# Patient Record
Sex: Female | Born: 1955 | Race: Black or African American | Hispanic: No | Smoking: Never smoker
Health system: Southern US, Community
[De-identification: ages and names within clinical notes are randomized; demographics above are authoritative.]

## PROBLEM LIST (undated history)

## (undated) DIAGNOSIS — M549 Dorsalgia, unspecified: Secondary | ICD-10-CM

## (undated) DIAGNOSIS — G4730 Sleep apnea, unspecified: Secondary | ICD-10-CM

## (undated) DIAGNOSIS — I10 Essential (primary) hypertension: Secondary | ICD-10-CM

## (undated) DIAGNOSIS — G4733 Obstructive sleep apnea (adult) (pediatric): Secondary | ICD-10-CM

## (undated) DIAGNOSIS — M2550 Pain in unspecified joint: Secondary | ICD-10-CM

## (undated) DIAGNOSIS — E7800 Pure hypercholesterolemia, unspecified: Secondary | ICD-10-CM

## (undated) DIAGNOSIS — E0590 Thyrotoxicosis, unspecified without thyrotoxic crisis or storm: Secondary | ICD-10-CM

## (undated) DIAGNOSIS — R609 Edema, unspecified: Secondary | ICD-10-CM

## (undated) DIAGNOSIS — R0602 Shortness of breath: Secondary | ICD-10-CM

## (undated) DIAGNOSIS — Z972 Presence of dental prosthetic device (complete) (partial): Secondary | ICD-10-CM

## (undated) DIAGNOSIS — I739 Peripheral vascular disease, unspecified: Secondary | ICD-10-CM

## (undated) DIAGNOSIS — Z9989 Dependence on other enabling machines and devices: Secondary | ICD-10-CM

## (undated) DIAGNOSIS — M1990 Unspecified osteoarthritis, unspecified site: Secondary | ICD-10-CM

## (undated) HISTORY — DX: Dorsalgia, unspecified: M54.9

## (undated) HISTORY — DX: Edema, unspecified: R60.9

## (undated) HISTORY — DX: Obstructive sleep apnea (adult) (pediatric): G47.33

## (undated) HISTORY — DX: Peripheral vascular disease, unspecified: I73.9

## (undated) HISTORY — DX: Pure hypercholesterolemia, unspecified: E78.00

## (undated) HISTORY — DX: Pain in unspecified joint: M25.50

## (undated) HISTORY — DX: Dependence on other enabling machines and devices: Z99.89

## (undated) HISTORY — PX: DILATION AND CURETTAGE OF UTERUS: SHX78

---

## 1983-01-23 HISTORY — PX: CHOLECYSTECTOMY: SHX55

## 1998-05-25 ENCOUNTER — Other Ambulatory Visit: Admission: RE | Admit: 1998-05-25 | Discharge: 1998-05-25 | Payer: Self-pay | Admitting: Family Medicine

## 1998-11-28 ENCOUNTER — Emergency Department (HOSPITAL_COMMUNITY): Admission: EM | Admit: 1998-11-28 | Discharge: 1998-11-28 | Payer: Self-pay | Admitting: *Deleted

## 2000-07-01 ENCOUNTER — Other Ambulatory Visit: Admission: RE | Admit: 2000-07-01 | Discharge: 2000-07-01 | Payer: Self-pay | Admitting: Family Medicine

## 2005-01-22 DIAGNOSIS — E0590 Thyrotoxicosis, unspecified without thyrotoxic crisis or storm: Secondary | ICD-10-CM

## 2005-01-22 HISTORY — DX: Thyrotoxicosis, unspecified without thyrotoxic crisis or storm: E05.90

## 2005-02-26 ENCOUNTER — Other Ambulatory Visit: Admission: RE | Admit: 2005-02-26 | Discharge: 2005-02-26 | Payer: Self-pay | Admitting: Family Medicine

## 2006-05-21 ENCOUNTER — Other Ambulatory Visit: Admission: RE | Admit: 2006-05-21 | Discharge: 2006-05-21 | Payer: Self-pay | Admitting: Obstetrics & Gynecology

## 2006-06-03 ENCOUNTER — Encounter (HOSPITAL_COMMUNITY): Admission: RE | Admit: 2006-06-03 | Discharge: 2006-06-04 | Payer: Self-pay | Admitting: Internal Medicine

## 2006-12-02 ENCOUNTER — Encounter: Admission: RE | Admit: 2006-12-02 | Discharge: 2006-12-02 | Payer: Self-pay | Admitting: Internal Medicine

## 2007-06-05 ENCOUNTER — Encounter: Admission: RE | Admit: 2007-06-05 | Discharge: 2007-06-05 | Payer: Self-pay | Admitting: Surgery

## 2008-01-22 ENCOUNTER — Other Ambulatory Visit: Admission: RE | Admit: 2008-01-22 | Discharge: 2008-01-22 | Payer: Self-pay | Admitting: Obstetrics & Gynecology

## 2009-08-22 ENCOUNTER — Ambulatory Visit: Payer: Self-pay | Admitting: Family Medicine

## 2009-08-22 ENCOUNTER — Encounter (INDEPENDENT_AMBULATORY_CARE_PROVIDER_SITE_OTHER): Payer: Self-pay | Admitting: Family Medicine

## 2009-08-22 LAB — CONVERTED CEMR LAB
ALT: 13 units/L (ref 0–35)
AST: 16 units/L (ref 0–37)
Albumin: 4.2 g/dL (ref 3.5–5.2)
Alkaline Phosphatase: 117 units/L (ref 39–117)
BUN: 10 mg/dL (ref 6–23)
Basophils Absolute: 0 10*3/uL (ref 0.0–0.1)
Basophils Relative: 1 % (ref 0–1)
CO2: 24 meq/L (ref 19–32)
Calcium: 8.7 mg/dL (ref 8.4–10.5)
Chloride: 106 meq/L (ref 96–112)
Cholesterol: 250 mg/dL — ABNORMAL HIGH (ref 0–200)
Creatinine, Ser: 0.77 mg/dL (ref 0.40–1.20)
Eosinophils Absolute: 0 10*3/uL (ref 0.0–0.7)
Eosinophils Relative: 1 % (ref 0–5)
Free T4: 1.11 ng/dL (ref 0.80–1.80)
Glucose, Bld: 94 mg/dL (ref 70–99)
HCT: 41.2 % (ref 36.0–46.0)
HDL: 78 mg/dL (ref >39)
Hemoglobin: 13.6 g/dL (ref 12.0–15.0)
LDL Cholesterol: 157 mg/dL — ABNORMAL HIGH (ref 0–99)
Lymphocytes Relative: 48 % — ABNORMAL HIGH (ref 12–46)
Lymphs Abs: 1.9 10*3/uL (ref 0.7–4.0)
MCHC: 33 g/dL (ref 30.0–36.0)
MCV: 82.6 fL (ref 78.0–100.0)
Monocytes Absolute: 0.3 10*3/uL (ref 0.1–1.0)
Monocytes Relative: 8 % (ref 3–12)
Neutro Abs: 1.7 10*3/uL (ref 1.7–7.7)
Neutrophils Relative %: 42 % — ABNORMAL LOW (ref 43–77)
Platelets: 293 10*3/uL (ref 150–400)
Potassium: 4 meq/L (ref 3.5–5.3)
RBC: 4.99 M/uL (ref 3.87–5.11)
RDW: 14.5 % (ref 11.5–15.5)
Sodium: 140 meq/L (ref 135–145)
T3, Free: 2.6 pg/mL (ref 2.3–4.2)
TSH: 0.065 microintl units/mL — ABNORMAL LOW (ref 0.350–4.500)
Total Bilirubin: 0.5 mg/dL (ref 0.3–1.2)
Total CHOL/HDL Ratio: 3.2
Total Protein: 7.5 g/dL (ref 6.0–8.3)
Triglycerides: 77 mg/dL (ref <150)
VLDL: 15 mg/dL (ref 0–40)
WBC: 4.1 10*3/uL (ref 4.0–10.5)

## 2009-08-30 ENCOUNTER — Ambulatory Visit (HOSPITAL_COMMUNITY): Admission: RE | Admit: 2009-08-30 | Discharge: 2009-08-30 | Payer: Self-pay | Admitting: Family Medicine

## 2009-09-23 ENCOUNTER — Ambulatory Visit: Payer: Self-pay | Admitting: Internal Medicine

## 2009-10-07 ENCOUNTER — Encounter (INDEPENDENT_AMBULATORY_CARE_PROVIDER_SITE_OTHER): Payer: Self-pay | Admitting: Family Medicine

## 2009-10-07 LAB — CONVERTED CEMR LAB
Basophils Absolute: 0 10*3/uL (ref 0.0–0.1)
Basophils Relative: 1 % (ref 0–1)
Eosinophils Absolute: 0 10*3/uL (ref 0.0–0.7)
Eosinophils Relative: 1 % (ref 0–5)
HCT: 45.9 % (ref 36.0–46.0)
Hemoglobin: 14.6 g/dL (ref 12.0–15.0)
Lymphocytes Relative: 43 % (ref 12–46)
Lymphs Abs: 2.1 10*3/uL (ref 0.7–4.0)
MCHC: 31.8 g/dL (ref 30.0–36.0)
MCV: 84.4 fL (ref 78.0–100.0)
Monocytes Absolute: 0.4 10*3/uL (ref 0.1–1.0)
Monocytes Relative: 8 % (ref 3–12)
Neutro Abs: 2.4 10*3/uL (ref 1.7–7.7)
Neutrophils Relative %: 48 % (ref 43–77)
Platelets: 309 10*3/uL (ref 150–400)
RBC: 5.44 M/uL — ABNORMAL HIGH (ref 3.87–5.11)
RDW: 15 % (ref 11.5–15.5)
TSH: 0.657 microintl units/mL (ref 0.350–4.500)
WBC: 5 10*3/uL (ref 4.0–10.5)

## 2010-01-22 DIAGNOSIS — N950 Postmenopausal bleeding: Secondary | ICD-10-CM | POA: Insufficient documentation

## 2010-04-03 ENCOUNTER — Encounter (HOSPITAL_COMMUNITY)
Admission: RE | Admit: 2010-04-03 | Discharge: 2010-04-03 | Disposition: A | Discharge status: Home / Self Care | Payer: BC Managed Care – PPO | Source: Ambulatory Visit | Attending: Obstetrics and Gynecology | Admitting: Obstetrics and Gynecology

## 2010-04-03 DIAGNOSIS — Z01810 Encounter for preprocedural cardiovascular examination: Secondary | ICD-10-CM | POA: Insufficient documentation

## 2010-04-03 DIAGNOSIS — Z01812 Encounter for preprocedural laboratory examination: Secondary | ICD-10-CM | POA: Insufficient documentation

## 2010-04-03 LAB — CBC
HCT: 40.1 % (ref 36.0–46.0)
Hemoglobin: 12.8 g/dL (ref 12.0–15.0)
MCH: 27.1 pg (ref 26.0–34.0)
MCHC: 31.9 g/dL (ref 30.0–36.0)
RBC: 4.72 MIL/uL (ref 3.87–5.11)

## 2010-04-03 LAB — BASIC METABOLIC PANEL
CO2: 26 mEq/L (ref 19–32)
Calcium: 9.2 mg/dL (ref 8.4–10.5)
Chloride: 105 mEq/L (ref 96–112)
Creatinine, Ser: 1.1 mg/dL (ref 0.4–1.2)
Glucose, Bld: 83 mg/dL (ref 70–99)
Sodium: 138 mEq/L (ref 135–145)

## 2010-04-14 ENCOUNTER — Other Ambulatory Visit: Payer: Self-pay | Admitting: Obstetrics and Gynecology

## 2010-04-14 ENCOUNTER — Ambulatory Visit (HOSPITAL_COMMUNITY)
Admission: RE | Admit: 2010-04-14 | Discharge: 2010-04-14 | Disposition: A | Discharge status: Home / Self Care | Payer: BC Managed Care – PPO | Source: Ambulatory Visit | Attending: Obstetrics and Gynecology | Admitting: Obstetrics and Gynecology

## 2010-04-14 DIAGNOSIS — N840 Polyp of corpus uteri: Secondary | ICD-10-CM | POA: Insufficient documentation

## 2010-04-14 DIAGNOSIS — N950 Postmenopausal bleeding: Secondary | ICD-10-CM | POA: Insufficient documentation

## 2010-04-20 NOTE — Op Note (Signed)
  Kaylee Carpenter, Kaylee Carpenter           ACCOUNT NO.:  [id number]  MEDICAL RECORD NO.:  [id number]           PATIENT TYPE:  O  LOCATION:  [facility]                          [facility]  PHYSICIAN:  Osborn Coho, M.D.   DATE OF BIRTH:  November 24, 1955  DATE OF PROCEDURE:  04/14/2010 DATE OF DISCHARGE:                              OPERATIVE REPORT   PREOPERATIVE DIAGNOSIS:  Postmenopausal bleeding.  POSTOPERATIVE DIAGNOSIS:  Postmenopausal bleeding.  PROCEDURES: 1. Hysteroscopy. 2. Dilatation and curettage.  ATTENDING SURGEON:  Osborn Coho, MD  ANESTHESIA:  General via LMA.  SPECIMENS TO PATHOLOGY:  Endometrial curettings.  FINDINGS:  Uterus sounded to approximately 9 cm.  FLUIDS:  1000 mL.  URINE OUTPUT:  Quantity sufficient via straight cath prior to procedure.  ESTIMATED BLOOD LOSS:  Minimal.  COMPLICATIONS:  None.  PROCEDURE IN DETAIL:  The patient was taken to the operating room after the risks, benefits, and alternatives discussed with the patient.  The patient verbalized understanding, consent signed and witnessed.  The patient was placed under general anesthesia and prepped and draped in normal sterile fashion in the dorsal lithotomy position.  A weighted speculum was placed in the patient's vagina and the anterior vaginal wall retracted and the anterior lip of the cervix grasped with a single- tooth tenaculum.  A paracervical block was administered using a total of 10 mL of 1% lidocaine.  The internal os was little stenotic and after dilation, the hysteroscope was able to be introduced and the uterus sounded to 9 cm.  Curettage was performed until a gritty texture was noted and endometrial curettings sent to pathology.  The hysteroscope was introduced once again and no obvious intracavitary lesions were noted.  All instruments were removed. There was good hemostasis at the tenaculum site.  Count was correct. The patient tolerated the procedure well and was awaiting  transfer to the recovery room in good condition.     Osborn Coho, M.D.     AR/MEDQ  D:  04/14/2010  T:  04/15/2010  Job:  [id number]  Electronically Signed by Osborn Coho M.D. on 04/20/2010 09:52:01 AM

## 2010-04-20 NOTE — H&P (Signed)
NAMERAMANI, RIVA NO.:  [id number]  MEDICAL RECORD NO.:  [id number]         PATIENT TYPE:  WAMB  LOCATION:                                [facility]  PHYSICIAN:  Osborn Coho, M.D.   DATE OF BIRTH:  04/22/1955  DATE OF ADMISSION:  04/14/2010 DATE OF DISCHARGE:                             HISTORY & PHYSICAL   HISTORY OF PRESENT ILLNESS:  Ms. Stanforth is a 55 year old divorced black female para 2-0-2-2 presenting for hysteroscopy D and C because of postmenopausal bleeding and endometrial masses.  During the patient's annual GYN exam in February 2012, she gave a history of having spotted vaginally on one occasion with wiping in the previous month.  She denies any cramping associated with this spotting, urinary tract symptoms, vaginitis symptoms, or dyspareunia.  The patient does admit, however, to occasional constipation.  A pelvic ultrasound showed uterus measuring 6.55 x 4.90 x 3.39 cm with 2 hyperechoic masses measuring 0.79 x 0.56 cm and 0.62 x 0.45 cm; both containing single blood flow with color flow Doppler, which are suggestive of polyps.  Additionally, the patient was noted to have a posterior fibroid measuring 1.70 x 1.40 x 1.64 cm. Given the patient's postmenopausal state and symptoms of vaginal bleeding with ultrasound findings, she has consented to proceed with hysteroscopy D and C for further evaluation and management.  PAST MEDICAL HISTORY/OB HISTORY:  Gravida 4, para 2-0-2-2.  The patient had one cesarean section and one spontaneous vaginal birth.  GYN HISTORY:  Menarche 55 years old.  She became menopausal at age 16. Denies any history of sexually transmitted diseases or abnormal Pap smears.  Her last Pap smear was in February 2012.  MEDICAL HISTORY:  Graves disease, anemia, and left ankle fracture.  SURGICAL HISTORY:  1961 tonsillectomy, 1990 cholecystectomy, 1993 left breast biopsy, which was benign.  Denies any problems with  anesthesia or history of blood transfusions.  FAMILY HISTORY:  Thyroid disease, colon cancer, hypertension.  HABITS:  She denies any alcohol, tobacco, or illicit drug use.  SOCIAL HISTORY:  The patient is divorced.  She works for [employer] as a Tourist information centre manager.  CURRENT MEDICATIONS:  Crestor 5 mg and a blood pressure medication, the patient does not recall the name of.  She just began this 3 weeks ago.  ALLERGIES:  She has no known drug allergies, but does admit to sensitivities to LATEX, which causes her skin to slough.  Denies any sensitivity to shellfish, peanuts, or soy.  REVIEW OF SYSTEMS:  The patient wears corrective lenses.  She has chronic back pain.  She does have some shortness of breath on exertion, but had a negative cardiac stress test 2 weeks ago.  Admits to frequent fatigue, occasional pedal edema, occasional headaches, occasional pain in her legs, but denies any chest pain, vision changes, nausea, vomiting, diarrhea, and except as is mentioned in history of present illness, the patient's review of systems is otherwise negative.  PHYSICAL EXAMINATION:  VITAL SIGNS:  Blood pressure is 130/80, pulse is 70, respirations 16, temperature 96.8 degrees Fahrenheit orally, weight 308 pounds, height 5 feet and 6 inches tall.  Body mass index  is 48. NECK:  Supple without masses.  There is no thyromegaly or cervical adenopathy. HEART:  Regular rate and rhythm. LUNGS:  Occasional rhonchi bilaterally that clear when the patient coughs. BACK:  No CVA tenderness. ABDOMEN:  No tenderness, masses, or organomegaly. EXTREMITIES:  No clubbing, cyanosis.  The patient does have bilateral brawny lower extremity edema. PELVIC:  EG, BUS is normal.  Vagina is normal.  Cervix is nontender without lesions.  Uterus appears normal size, shape, and consistency without tenderness.  Adnexa without tenderness or masses.  IMPRESSION: 1. Postmenopausal bleeding. 2. Endometrial  polyps.  DISPOSITION:  A discussion was held with the patient regarding the indications for her procedures along with their risks, which include but are not limited to reaction to anesthesia, damage to adjacent organs, infection, and bleeding.  The patient was given ACOG brochure on hysteroscopy.  She has consented to proceed with hysteroscopy D and C [facility] April 14, 2010, at 8:45 a.m.     [place] J. Lowell Guitar, P.A.-C   ______________________________ Osborn Coho, M.D.    EJP/MEDQ  D:  04/07/2010  T:  04/08/2010  Job:  [id number]  Electronically Signed by Raylene Everts. on 04/10/2010 10:23:42 PM Electronically Signed by Osborn Coho M.D. on 04/20/2010 09:51:59 AM

## 2010-05-30 ENCOUNTER — Other Ambulatory Visit: Payer: Self-pay | Admitting: Internal Medicine

## 2010-05-30 DIAGNOSIS — E042 Nontoxic multinodular goiter: Secondary | ICD-10-CM

## 2010-06-07 ENCOUNTER — Other Ambulatory Visit: Payer: Self-pay | Admitting: Interventional Radiology

## 2010-06-07 ENCOUNTER — Ambulatory Visit
Admission: RE | Admit: 2010-06-07 | Discharge: 2010-06-07 | Disposition: A | Discharge status: Home / Self Care | Payer: BC Managed Care – PPO | Source: Ambulatory Visit | Attending: Internal Medicine | Admitting: Internal Medicine

## 2010-06-07 ENCOUNTER — Other Ambulatory Visit (HOSPITAL_COMMUNITY)
Admission: RE | Admit: 2010-06-07 | Discharge: 2010-06-07 | Disposition: A | Discharge status: Home / Self Care | Payer: BC Managed Care – PPO | Source: Ambulatory Visit | Attending: Interventional Radiology | Admitting: Interventional Radiology

## 2010-06-07 ENCOUNTER — Ambulatory Visit: Admission: RE | Admit: 2010-06-07 | Payer: BC Managed Care – PPO | Source: Ambulatory Visit

## 2010-06-07 DIAGNOSIS — E042 Nontoxic multinodular goiter: Secondary | ICD-10-CM

## 2010-06-07 DIAGNOSIS — E049 Nontoxic goiter, unspecified: Secondary | ICD-10-CM | POA: Insufficient documentation

## 2012-01-07 ENCOUNTER — Encounter: Payer: Self-pay | Admitting: Obstetrics and Gynecology

## 2012-01-07 ENCOUNTER — Ambulatory Visit (INDEPENDENT_AMBULATORY_CARE_PROVIDER_SITE_OTHER): Payer: BC Managed Care – PPO | Admitting: Obstetrics and Gynecology

## 2012-01-07 VITALS — BP 98/60 | Ht 66.0 in | Wt 308.0 lb

## 2012-01-07 DIAGNOSIS — R638 Other symptoms and signs concerning food and fluid intake: Secondary | ICD-10-CM

## 2012-01-07 DIAGNOSIS — Z01419 Encounter for gynecological examination (general) (routine) without abnormal findings: Secondary | ICD-10-CM

## 2012-01-07 DIAGNOSIS — Z124 Encounter for screening for malignant neoplasm of cervix: Secondary | ICD-10-CM

## 2012-01-07 NOTE — Progress Notes (Signed)
Patient ID: Kaylee Carpenter, female   DOB: [birth year], 56 y.o.   MRN: [id number] Contraception PM Last pap 2012 Last Mammo 2 to 3 years ago Last Colonoscopy 1 yr ago Last Dexa Scan never Primary MD Allyne Gee, M.D. Abuse at Home None  No complaints  Filed Vitals:   01/07/12 1508  BP: 98/60   ROS: noncontributory  Physical Examination: General appearance - alert, well appearing, and in no distress Neck - supple, no significant adenopathy Chest - clear to auscultation, no wheezes, rales or rhonchi, symmetric air entry Heart - normal rate and regular rhythm Abdomen - soft, nontender, nondistended, no masses or organomegaly Breasts - breasts appear normal, no suspicious masses, no skin or nipple changes or axillary nodes Pelvic - normal external genitalia, vulva, vagina, cervix, uterus and adnexa, difficult secondary to habitus Back exam - no CVAT Extremities - no edema, redness or tenderness in the calves or thighs  A/P Pelvic u/s at [facility] to eval ovaries secondary to habitus Mammo at [facility] Pap today

## 2012-01-07 NOTE — Addendum Note (Signed)
Addended by: Marla Roe A on: 01/07/2012 04:01 PM   Modules accepted: Orders

## 2012-01-08 LAB — PAP IG W/ RFLX HPV ASCU

## 2012-02-11 ENCOUNTER — Other Ambulatory Visit: Payer: Self-pay | Admitting: Obstetrics and Gynecology

## 2012-02-11 DIAGNOSIS — R638 Other symptoms and signs concerning food and fluid intake: Secondary | ICD-10-CM

## 2012-02-12 ENCOUNTER — Encounter: Payer: BC Managed Care – PPO | Admitting: Obstetrics and Gynecology

## 2012-02-12 ENCOUNTER — Other Ambulatory Visit: Payer: BC Managed Care – PPO

## 2012-02-29 ENCOUNTER — Ambulatory Visit
Admission: RE | Admit: 2012-02-29 | Discharge: 2012-02-29 | Disposition: A | Discharge status: Home / Self Care | Payer: BC Managed Care – PPO | Source: Ambulatory Visit | Attending: Obstetrics and Gynecology | Admitting: Obstetrics and Gynecology

## 2012-02-29 DIAGNOSIS — Z01419 Encounter for gynecological examination (general) (routine) without abnormal findings: Secondary | ICD-10-CM

## 2012-03-04 ENCOUNTER — Encounter: Payer: BC Managed Care – PPO | Admitting: Obstetrics and Gynecology

## 2012-03-04 ENCOUNTER — Other Ambulatory Visit: Payer: BC Managed Care – PPO

## 2012-06-18 ENCOUNTER — Other Ambulatory Visit: Payer: Self-pay | Admitting: Obstetrics and Gynecology

## 2012-06-18 DIAGNOSIS — R102 Pelvic and perineal pain: Secondary | ICD-10-CM

## 2012-06-23 ENCOUNTER — Other Ambulatory Visit: Payer: BC Managed Care – PPO

## 2012-06-27 ENCOUNTER — Ambulatory Visit
Admission: RE | Admit: 2012-06-27 | Discharge: 2012-06-27 | Disposition: A | Discharge status: Home / Self Care | Payer: BC Managed Care – PPO | Source: Ambulatory Visit | Attending: Obstetrics and Gynecology | Admitting: Obstetrics and Gynecology

## 2012-06-27 DIAGNOSIS — R102 Pelvic and perineal pain: Secondary | ICD-10-CM

## 2012-06-27 MED ORDER — IOHEXOL 300 MG/ML  SOLN: 100.0000 mL | Freq: Once | INTRAMUSCULAR | Status: AC | PRN

## 2013-06-23 ENCOUNTER — Other Ambulatory Visit: Payer: Self-pay

## 2013-08-05 ENCOUNTER — Other Ambulatory Visit: Payer: Self-pay | Admitting: Obstetrics and Gynecology

## 2013-09-03 ENCOUNTER — Other Ambulatory Visit (HOSPITAL_COMMUNITY): Payer: Self-pay | Admitting: Obstetrics and Gynecology

## 2013-09-03 NOTE — H&P (Signed)
Kaylee Carpenter is a 58 y.o. female P: 2-0-2-2 presents for hysteroscopy, dilatation, curettage because of post-menopausal bleeding.  The patient has been menopausal since age 31 but in January 2015 she began random spotting that lasted until June 2015.  She denies any cramping associated with these episodes nor other menstrual-like  symptoms.  A pelvic ultrasound in June showed: uterus-4.46 x 4.63 x 3.69 cm that was retroflexed, endometrium-6.88 mm and uterine length from fundus to external os = 7 cm;  ovaries were not seen and the study was technically difficult due to patient's body habitus.  Endometrial biopsy done at that same time returned limited specimen but  benign results.  She goes on t deny any urinary tract or bowel changes but admits to chronic back pain that has been attributed to her weight.  Given her menopausal status and thickening of her endometrium,  the patient has decided to proceed with surgical evaluation and management of her symptoms.   Past Medical History  OB History: G:4   P: 2-0-2-2;  1989-C-section and 1988-SVB  GYN History: menarche:58 YO;    LMP: Menopausal age 59   Denies history of abnormal PAP smear or STDs;   Last PAP smear: 2013  Medical History: Vitamin D Deficiency, Hypertension, Hypercholesterolemia, Left Ankle Fracture, Graves Disease, Thyroid Nodule (benign) and Anemia  Surgical History: 1961  Tonsillectomy;  1990 Cholecystectomy;  1993 Left Breast Biopsy (benign); 2012 Hysteroscopy D & C (for post-menopausal bleeding) Denies problems with anesthesia or history of blood transfusions  Family History: Thyroid Disease, Colon Cancer, Hypertension and Diabetes Mellitus  Social History: Divorced and Employed in Therapist, art;  Denies alcohol or tobacco use   Medications:  Maxide 37.5/25 daily Carvedilol 25 mg daily  Allergies  Allergen Reactions  . Latex   Adhesives-cause skin to slough  Denies sensitivity to peanuts, shellfish or  soy.  ROS:  Admits to glasses and chronic lower back pain;   Denies headache, vision changes, nasal congestion, dysphagia, tinnitus, dizziness, hoarseness, cough,  chest pain, shortness of breath, nausea, vomiting, diarrhea,constipation,  urinary frequency, urgency  dysuria, hematuria, vaginitis symptoms, pelvic pain, swelling of joints,easy bruising,  myalgias, arthralgias, skin rashes, unexplained weight loss and except as is mentioned in the history of present illness, patient's review of systems is otherwise negative.  Physical Exam  Bp: 116/78   P: 64   R: 20   Temperature: 97.8 degrees F orally    Weight: 317 lbs.  Height: 5'6"   BMI: 51.2  Neck: supple without masses or thyromegaly Lungs: clear to auscultation Heart: regular rate and rhythm Abdomen: soft, non-tender and no organomegaly Pelvic:EGBUS- wnl; vagina-normal rugae; uterus-normal size with moderate descensus, (exam limited by habitus)  cervix without lesions or motion tenderness; adnexae-no tenderness or masses Extremities:  no clubbing, cyanosis or edema   Assesment: Post-menopausal Bleeding   Disposition:  A discussion was held with patient regarding the indication for her procedure(s) along with the risks, which include but are not limited to: reaction to anesthesia, damage to adjacent organs, infection and excessive bleeding. The patient verbalized understanding of these risks and has consented to proceed with Hysteroscopy, Dilatation and Curettage at [place] on September 17, 2013.   CSN# [id number]   Dewarren Ledbetter J. Florene Glen, PA-C  for Dr. Harvie Bridge. Mancel Bale

## 2013-09-14 NOTE — Patient Instructions (Addendum)
   Your procedure is scheduled on:  Thursday, August 27  Enter through the [employer] [facility] at:  [place] up the phone at the desk and dial [phone] and inform us of your arrival.  Please call this number if you have any problems the morning of surgery: [phone]  Remember: Do not eat food after midnight: Wednesday Do not drink clear liquids after: 9 AM Thursday, day of surgery   Do not wear jewelry, make-up, or FINGER nail polish No metal in your hair or on your body. Do not wear lotions, powders, perfumes.  You may wear deodorant.  Do not bring valuables to the hospital. Contacts, dentures or bridgework may not be worn into surgery.   Patients discharged on the day of surgery will not be allowed to drive home.

## 2013-09-15 ENCOUNTER — Encounter (HOSPITAL_COMMUNITY)
Admission: RE | Admit: 2013-09-15 | Discharge: 2013-09-15 | Disposition: A | Discharge status: Home / Self Care | Payer: BC Managed Care – PPO | Source: Ambulatory Visit | Attending: Obstetrics and Gynecology | Admitting: Obstetrics and Gynecology

## 2013-09-15 ENCOUNTER — Encounter (HOSPITAL_COMMUNITY): Payer: Self-pay

## 2013-09-15 DIAGNOSIS — I10 Essential (primary) hypertension: Secondary | ICD-10-CM | POA: Diagnosis not present

## 2013-09-15 DIAGNOSIS — D649 Anemia, unspecified: Secondary | ICD-10-CM | POA: Diagnosis not present

## 2013-09-15 DIAGNOSIS — N859 Noninflammatory disorder of uterus, unspecified: Secondary | ICD-10-CM | POA: Diagnosis not present

## 2013-09-15 DIAGNOSIS — G4730 Sleep apnea, unspecified: Secondary | ICD-10-CM | POA: Diagnosis not present

## 2013-09-15 DIAGNOSIS — E7800 Pure hypercholesterolemia, unspecified: Secondary | ICD-10-CM | POA: Diagnosis not present

## 2013-09-15 DIAGNOSIS — E559 Vitamin D deficiency, unspecified: Secondary | ICD-10-CM | POA: Diagnosis not present

## 2013-09-15 DIAGNOSIS — N950 Postmenopausal bleeding: Secondary | ICD-10-CM | POA: Diagnosis not present

## 2013-09-15 DIAGNOSIS — Z6841 Body Mass Index (BMI) 40.0 and over, adult: Secondary | ICD-10-CM | POA: Diagnosis not present

## 2013-09-15 DIAGNOSIS — E0500 Thyrotoxicosis with diffuse goiter without thyrotoxic crisis or storm: Secondary | ICD-10-CM | POA: Diagnosis not present

## 2013-09-15 HISTORY — DX: Sleep apnea, unspecified: G47.30

## 2013-09-15 HISTORY — DX: Thyrotoxicosis, unspecified without thyrotoxic crisis or storm: E05.90

## 2013-09-15 HISTORY — DX: Essential (primary) hypertension: I10

## 2013-09-15 HISTORY — DX: Shortness of breath: R06.02

## 2013-09-15 LAB — COMPREHENSIVE METABOLIC PANEL
ALBUMIN: 3.5 g/dL (ref 3.5–5.2)
ALT: 14 U/L (ref 0–35)
AST: 15 U/L (ref 0–37)
Alkaline Phosphatase: 93 U/L (ref 39–117)
Anion gap: 12 (ref 5–15)
BUN: 15 mg/dL (ref 6–23)
CALCIUM: 9.2 mg/dL (ref 8.4–10.5)
CO2: 26 mEq/L (ref 19–32)
CREATININE: 1.01 mg/dL (ref 0.50–1.10)
Chloride: 101 mEq/L (ref 96–112)
GFR calc Af Amer: 69 mL/min — ABNORMAL LOW (ref >90)
GFR calc non Af Amer: 59 mL/min — ABNORMAL LOW (ref >90)
Glucose, Bld: 96 mg/dL (ref 70–99)
Potassium: 4 mEq/L (ref 3.7–5.3)
Sodium: 139 mEq/L (ref 137–147)
Total Bilirubin: <0.2 mg/dL — ABNORMAL LOW (ref 0.3–1.2)
Total Protein: 7.2 g/dL (ref 6.0–8.3)

## 2013-09-15 LAB — CBC
HCT: 38.1 % (ref 36.0–46.0)
Hemoglobin: 12.8 g/dL (ref 12.0–15.0)
MCH: 28.1 pg (ref 26.0–34.0)
MCHC: 33.6 g/dL (ref 30.0–36.0)
MCV: 83.6 fL (ref 78.0–100.0)
PLATELETS: 271 10*3/uL (ref 150–400)
RBC: 4.56 MIL/uL (ref 3.87–5.11)
RDW: 14.3 % (ref 11.5–15.5)
WBC: 5.7 10*3/uL (ref 4.0–10.5)

## 2013-09-17 ENCOUNTER — Encounter (HOSPITAL_COMMUNITY): Payer: BC Managed Care – PPO | Admitting: Anesthesiology

## 2013-09-17 ENCOUNTER — Encounter (HOSPITAL_COMMUNITY)
Admission: RE | Disposition: A | Discharge status: Home / Self Care | Payer: Self-pay | Source: Ambulatory Visit | Attending: Obstetrics and Gynecology

## 2013-09-17 ENCOUNTER — Ambulatory Visit (HOSPITAL_COMMUNITY): Payer: BC Managed Care – PPO | Admitting: Anesthesiology

## 2013-09-17 ENCOUNTER — Ambulatory Visit (HOSPITAL_COMMUNITY)
Admission: RE | Admit: 2013-09-17 | Discharge: 2013-09-17 | Disposition: A | Discharge status: Home / Self Care | Payer: BC Managed Care – PPO | Source: Ambulatory Visit | Attending: Obstetrics and Gynecology | Admitting: Obstetrics and Gynecology

## 2013-09-17 DIAGNOSIS — N950 Postmenopausal bleeding: Secondary | ICD-10-CM | POA: Insufficient documentation

## 2013-09-17 DIAGNOSIS — D649 Anemia, unspecified: Secondary | ICD-10-CM | POA: Insufficient documentation

## 2013-09-17 DIAGNOSIS — E0500 Thyrotoxicosis with diffuse goiter without thyrotoxic crisis or storm: Secondary | ICD-10-CM | POA: Insufficient documentation

## 2013-09-17 DIAGNOSIS — E7800 Pure hypercholesterolemia, unspecified: Secondary | ICD-10-CM | POA: Insufficient documentation

## 2013-09-17 DIAGNOSIS — E559 Vitamin D deficiency, unspecified: Secondary | ICD-10-CM | POA: Insufficient documentation

## 2013-09-17 DIAGNOSIS — I10 Essential (primary) hypertension: Secondary | ICD-10-CM | POA: Insufficient documentation

## 2013-09-17 DIAGNOSIS — Z6841 Body Mass Index (BMI) 40.0 and over, adult: Secondary | ICD-10-CM | POA: Insufficient documentation

## 2013-09-17 DIAGNOSIS — G4730 Sleep apnea, unspecified: Secondary | ICD-10-CM | POA: Insufficient documentation

## 2013-09-17 DIAGNOSIS — N859 Noninflammatory disorder of uterus, unspecified: Secondary | ICD-10-CM | POA: Insufficient documentation

## 2013-09-17 HISTORY — PX: DILATATION & CURRETTAGE/HYSTEROSCOPY WITH RESECTOCOPE: SHX5572

## 2013-09-17 LAB — CBC
HEMATOCRIT: 37.7 % (ref 36.0–46.0)
HEMOGLOBIN: 12.6 g/dL (ref 12.0–15.0)
MCH: 28.3 pg (ref 26.0–34.0)
MCHC: 33.4 g/dL (ref 30.0–36.0)
MCV: 84.7 fL (ref 78.0–100.0)
Platelets: 249 10*3/uL (ref 150–400)
RBC: 4.45 MIL/uL (ref 3.87–5.11)
RDW: 14.4 % (ref 11.5–15.5)
WBC: 7.7 10*3/uL (ref 4.0–10.5)

## 2013-09-17 LAB — BASIC METABOLIC PANEL
Anion gap: 12 (ref 5–15)
BUN: 14 mg/dL (ref 6–23)
CHLORIDE: 101 meq/L (ref 96–112)
CO2: 25 mEq/L (ref 19–32)
Calcium: 9.2 mg/dL (ref 8.4–10.5)
Creatinine, Ser: 0.98 mg/dL (ref 0.50–1.10)
GFR calc Af Amer: 71 mL/min — ABNORMAL LOW (ref >90)
GFR calc non Af Amer: 61 mL/min — ABNORMAL LOW (ref >90)
Glucose, Bld: 97 mg/dL (ref 70–99)
POTASSIUM: 4.1 meq/L (ref 3.7–5.3)
Sodium: 138 mEq/L (ref 137–147)

## 2013-09-17 SURGERY — DILATATION & CURETTAGE/HYSTEROSCOPY WITH RESECTOCOPE
Anesthesia: Choice

## 2013-09-17 MED ORDER — IBUPROFEN 600 MG PO TABS: 600.0000 mg | ORAL_TABLET | Freq: Four times a day (QID) | ORAL | Status: DC | PRN

## 2013-09-17 MED ORDER — LIDOCAINE HCL 2 % IJ SOLN
INTRAMUSCULAR | Status: DC | PRN
  Administered 2013-09-17: 10 mL

## 2013-09-17 MED ORDER — MIDAZOLAM HCL 2 MG/2ML IJ SOLN
INTRAMUSCULAR | Status: AC
  Filled 2013-09-17: qty 2

## 2013-09-17 MED ORDER — LIDOCAINE HCL 2 % IJ SOLN
INTRAMUSCULAR | Status: AC
  Filled 2013-09-17: qty 20

## 2013-09-17 MED ORDER — DEXAMETHASONE SODIUM PHOSPHATE 10 MG/ML IJ SOLN
INTRAMUSCULAR | Status: DC | PRN
  Administered 2013-09-17: 4 mg via INTRAVENOUS

## 2013-09-17 MED ORDER — FENTANYL CITRATE 0.05 MG/ML IJ SOLN
INTRAMUSCULAR | Status: AC
  Filled 2013-09-17: qty 5

## 2013-09-17 MED ORDER — HYDROCODONE-ACETAMINOPHEN 5-325 MG PO TABS: 1.0000 | ORAL_TABLET | Freq: Four times a day (QID) | ORAL | Status: DC | PRN

## 2013-09-17 MED ORDER — ONDANSETRON HCL 4 MG/2ML IJ SOLN
INTRAMUSCULAR | Status: DC | PRN
  Administered 2013-09-17: 4 mg via INTRAVENOUS

## 2013-09-17 MED ORDER — MIDAZOLAM HCL 2 MG/2ML IJ SOLN
INTRAMUSCULAR | Status: DC | PRN
  Administered 2013-09-17: 2 mg via INTRAVENOUS

## 2013-09-17 MED ORDER — SCOPOLAMINE 1 MG/3DAYS TD PT72
MEDICATED_PATCH | TRANSDERMAL | Status: AC
  Administered 2013-09-17: 1.5 mg via TRANSDERMAL
  Filled 2013-09-17: qty 1

## 2013-09-17 MED ORDER — EPHEDRINE SULFATE 50 MG/ML IJ SOLN
INTRAMUSCULAR | Status: DC | PRN
  Administered 2013-09-17: 5 mg via INTRAVENOUS

## 2013-09-17 MED ORDER — LACTATED RINGERS IV SOLN
INTRAVENOUS | Status: DC
Start: 2013-09-17 — End: 2013-09-17
  Administered 2013-09-17: 12:00:00 via INTRAVENOUS

## 2013-09-17 MED ORDER — PROPOFOL 10 MG/ML IV BOLUS
INTRAVENOUS | Status: DC | PRN
  Administered 2013-09-17: 200 mg via INTRAVENOUS

## 2013-09-17 MED ORDER — FENTANYL CITRATE 0.05 MG/ML IJ SOLN
INTRAMUSCULAR | Status: AC
  Administered 2013-09-17: 25 ug via INTRAVENOUS
  Filled 2013-09-17: qty 2

## 2013-09-17 MED ORDER — FENTANYL CITRATE 0.05 MG/ML IJ SOLN
25.0000 ug | INTRAMUSCULAR | Status: DC | PRN
  Administered 2013-09-17: 25 ug via INTRAVENOUS

## 2013-09-17 MED ORDER — LIDOCAINE HCL (CARDIAC) 20 MG/ML IV SOLN
INTRAVENOUS | Status: DC | PRN
Start: 2013-09-17 — End: 2013-09-17
  Administered 2013-09-17 (×2): 100 mg via INTRAVENOUS

## 2013-09-17 MED ORDER — EPHEDRINE 5 MG/ML INJ
INTRAVENOUS | Status: AC
  Filled 2013-09-17: qty 10

## 2013-09-17 MED ORDER — KETOROLAC TROMETHAMINE 30 MG/ML IJ SOLN
INTRAMUSCULAR | Status: DC | PRN
  Administered 2013-09-17: 30 mg via INTRAVENOUS

## 2013-09-17 MED ORDER — SCOPOLAMINE 1 MG/3DAYS TD PT72
1.0000 | MEDICATED_PATCH | TRANSDERMAL | Status: DC
  Administered 2013-09-17: 1.5 mg via TRANSDERMAL

## 2013-09-17 MED ORDER — FENTANYL CITRATE 0.05 MG/ML IJ SOLN
INTRAMUSCULAR | Status: DC | PRN
  Administered 2013-09-17: 100 ug via INTRAVENOUS
  Administered 2013-09-17: 50 ug via INTRAVENOUS
  Administered 2013-09-17: 100 ug via INTRAVENOUS

## 2013-09-17 SURGICAL SUPPLY — 20 items
CANISTER SUCT 3000ML (MISCELLANEOUS) ×2 IMPLANT
CATH ROBINSON RED A/P 16FR (CATHETERS) ×2 IMPLANT
CLOTH BEACON ORANGE TIMEOUT ST (SAFETY) ×2 IMPLANT
CONTAINER PREFILL 10% NBF 60ML (FORM) ×4 IMPLANT
DILATOR CANAL MILEX (MISCELLANEOUS) ×2 IMPLANT
DRAPE HYSTEROSCOPY (DRAPE) ×2 IMPLANT
DRSG TELFA 3X8 NADH (GAUZE/BANDAGES/DRESSINGS) ×2 IMPLANT
ELECT REM PT RETURN 9FT ADLT (ELECTROSURGICAL) ×2
ELECTRODE REM PT RTRN 9FT ADLT (ELECTROSURGICAL) ×1 IMPLANT
GLOVE BIO SURGEON STRL SZ7.5 (GLOVE) ×2 IMPLANT
GLOVE BIOGEL PI IND STRL 7.5 (GLOVE) ×1 IMPLANT
GLOVE BIOGEL PI INDICATOR 7.5 (GLOVE) ×1
GOWN STRL REUS W/TWL LRG LVL3 (GOWN DISPOSABLE) ×4 IMPLANT
LOOP ANGLED CUTTING 22FR (CUTTING LOOP) IMPLANT
PACK VAGINAL MINOR WOMEN LF (CUSTOM PROCEDURE TRAY) ×2 IMPLANT
PAD OB MATERNITY 4.3X12.25 (PERSONAL CARE ITEMS) ×2 IMPLANT
SET TUBING HYSTEROSCOPY 2 NDL (TUBING) ×2 IMPLANT
TOWEL OR 17X24 6PK STRL BLUE (TOWEL DISPOSABLE) ×4 IMPLANT
TUBE HYSTEROSCOPY W Y-CONNECT (TUBING) ×2 IMPLANT
WATER STERILE IRR 1000ML POUR (IV SOLUTION) ×2 IMPLANT

## 2013-09-17 NOTE — Transfer of Care (Signed)
Immediate Anesthesia Transfer of Care Note  Patient: Kaylee Carpenter  Procedure(s) Performed: Procedure(s): DILATATION & CURETTAGE, HYSTEROSCOPY, CYSTOSCOPY (N/A)  Patient Location: PACU  Anesthesia Type:General  Level of Consciousness: awake, alert  and oriented  Airway & Oxygen Therapy: Patient Spontanous Breathing and Patient connected to nasal cannula oxygen  Post-op Assessment: Report given to PACU RN and Post -op Vital signs reviewed and stable  Post vital signs: Reviewed and stable  Complications: No apparent anesthesia complications

## 2013-09-17 NOTE — Anesthesia Preprocedure Evaluation (Signed)
Anesthesia Evaluation  Patient identified by MRN, date of birth, ID band Patient awake    Reviewed: Allergy & Precautions, H&P , Patient's Chart, lab work & pertinent test results, reviewed documented beta blocker date and time   Airway Mallampati: II TM Distance: >3 FB Neck ROM: full    Dental no notable dental hx. (+) Chipped,    Pulmonary sleep apnea and Continuous Positive Airway Pressure Ventilation ,  breath sounds clear to auscultation  Pulmonary exam normal       Cardiovascular hypertension, On Medications Rhythm:regular Rate:Normal     Neuro/Psych    GI/Hepatic   Endo/Other  Morbid obesity  Renal/GU      Musculoskeletal   Abdominal   Peds  Hematology   Anesthesia Other Findings No difficulty with previous GA for Hysteroscopy (2011?)  Reproductive/Obstetrics                           Anesthesia Physical Anesthesia Plan  ASA: III  Anesthesia Plan:    Post-op Pain Management:    Induction: Intravenous  Airway Management Planned: LMA  Additional Equipment:   Intra-op Plan:   Post-operative Plan:   Informed Consent: I have reviewed the patients History and Physical, chart, labs and discussed the procedure including the risks, benefits and alternatives for the proposed anesthesia with the patient or authorized representative who has indicated his/her understanding and acceptance.   Dental Advisory Given and Dental advisory given  Plan Discussed with: CRNA and Surgeon  Anesthesia Plan Comments: (Discussed GA with LMA, possible sore throat, potential need to switch to ETT, N/V, pulmonary aspiration. Questions answered. )        Anesthesia Quick Evaluation

## 2013-09-17 NOTE — Discharge Instructions (Signed)
DISCHARGE INSTRUCTIONS: HYSTEROSCOPY  The following instructions have been prepared to help you care for yourself upon your return home.  Personal hygiene:  Use sanitary pads for vaginal drainage, not tampons.  Shower the day after your procedure.  NO tub baths, pools or Jacuzzis for 2-3 weeks.  Wipe front to back after using the bathroom.  Activity and limitations:  Do NOT drive or operate any equipment for 24 hours. The effects of anesthesia are still present and drowsiness may result.  Do NOT rest in bed all day.  Walking is encouraged.  Walk up and down stairs slowly.  You may resume your normal activity in one to two days or as indicated by your physician. Sexual activity: NO intercourse for at least 2 weeks after the procedure, or as indicated by your Doctor.  Diet: Eat a light meal as desired this evening. You may resume your usual diet tomorrow.  Return to Work: You may resume your work activities in one to two days or as indicated by Marine scientist.  What to expect after your surgery: Expect to have vaginal bleeding/discharge for 2-3 days and spotting for up to 10 days. It is not unusual to have soreness for up to 1-2 weeks. You may have a slight burning sensation when you urinate for the first day. Mild cramps may continue for a couple of days. You may have a regular period in 2-6 weeks.  NO IBUPROFEN PRODUCTS (MOTRIN,ADVIL) OR ALEVE UNTIL 8:00PM TODAY.   Call your doctor for any of the following:  Excessive vaginal bleeding or clotting, saturating and changing one pad every hour.  Inability to urinate 6 hours after discharge from hospital.  Pain not relieved by pain medication.  Fever of 100.4 F or greater.  Unusual vaginal discharge or odor.  Return to office _________________Call for an appointment ___________________ Patients signature: ______________________ Nurses signature ________________________  [place] [facility] [phone]

## 2013-09-17 NOTE — Anesthesia Postprocedure Evaluation (Signed)
  Anesthesia Post-op Note  Anesthesia Post Note  Patient: Kaylee Carpenter  Procedure(s) Performed: Procedure(s) (LRB): DILATATION & CURETTAGE, HYSTEROSCOPY, CYSTOSCOPY (N/A)  Anesthesia type: General  Patient location: PACU  Post pain: Pain level controlled  Post assessment: Post-op Vital signs reviewed  Last Vitals:  Filed Vitals:   09/17/13 1612  BP: 118/72  Pulse: 56  Temp: 36.4 C  Resp: 16    Post vital signs: Reviewed  Level of consciousness: sedated  Complications: No apparent anesthesia complications

## 2013-09-17 NOTE — Anesthesia Procedure Notes (Signed)
Procedure Name: LMA Insertion Date/Time: 09/17/2013 1:17 PM Performed by: Kaylen Nghiem, Sheron Nightingale Pre-anesthesia Checklist: Patient identified, Patient being monitored, Emergency Drugs available, Timeout performed and Suction available Patient Re-evaluated:Patient Re-evaluated prior to inductionOxygen Delivery Method: Circle system utilized Preoxygenation: Pre-oxygenation with 100% oxygen Intubation Type: IV induction LMA: LMA inserted LMA Size: 4.0 Number of attempts: 1 ETT to lip (cm): at lip. Dental Injury: Teeth and Oropharynx as per pre-operative assessment

## 2013-09-17 NOTE — H&P (View-Only) (Signed)
Kaylee Carpenter is a 58 y.o. female P: 2-0-2-2 presents for hysteroscopy, dilatation, curettage because of post-menopausal bleeding.  The patient has been menopausal since age 30 but in January 2015 she began random spotting that lasted until June 2015.  She denies any cramping associated with these episodes nor other menstrual-like  symptoms.  A pelvic ultrasound in June showed: uterus-4.46 x 4.63 x 3.69 cm that was retroflexed, endometrium-6.88 mm and uterine length from fundus to external os = 7 cm;  ovaries were not seen and the study was technically difficult due to patient's body habitus.  Endometrial biopsy done at that same time returned limited specimen but  benign results.  She goes on t deny any urinary tract or bowel changes but admits to chronic back pain that has been attributed to her weight.  Given her menopausal status and thickening of her endometrium,  the patient has decided to proceed with surgical evaluation and management of her symptoms.   Past Medical History  OB History: G:4   P: 2-0-2-2;  1989-C-section and 1988-SVB  GYN History: menarche:58 YO;    LMP: Menopausal age 52   Denies history of abnormal PAP smear or STDs;   Last PAP smear: 2013  Medical History: Vitamin D Deficiency, Hypertension, Hypercholesterolemia, Left Ankle Fracture, Graves Disease, Thyroid Nodule (benign) and Anemia  Surgical History: 1961  Tonsillectomy;  1990 Cholecystectomy;  1993 Left Breast Biopsy (benign); 2012 Hysteroscopy D & C (for post-menopausal bleeding) Denies problems with anesthesia or history of blood transfusions  Family History: Thyroid Disease, Colon Cancer, Hypertension and Diabetes Mellitus  Social History: Divorced and Employed in Therapist, art;  Denies alcohol or tobacco use   Medications:  Maxide 37.5/25 daily Carvedilol 25 mg daily  Allergies  Allergen Reactions  . Latex   Adhesives-cause skin to slough  Denies sensitivity to peanuts, shellfish or  soy.  ROS:  Admits to glasses and chronic lower back pain;   Denies headache, vision changes, nasal congestion, dysphagia, tinnitus, dizziness, hoarseness, cough,  chest pain, shortness of breath, nausea, vomiting, diarrhea,constipation,  urinary frequency, urgency  dysuria, hematuria, vaginitis symptoms, pelvic pain, swelling of joints,easy bruising,  myalgias, arthralgias, skin rashes, unexplained weight loss and except as is mentioned in the history of present illness, patient's review of systems is otherwise negative.  Physical Exam  Bp: 116/78   P: 64   R: 20   Temperature: 97.8 degrees F orally    Weight: 317 lbs.  Height: 5'6"   BMI: 51.2  Neck: supple without masses or thyromegaly Lungs: clear to auscultation Heart: regular rate and rhythm Abdomen: soft, non-tender and no organomegaly Pelvic:EGBUS- wnl; vagina-normal rugae; uterus-normal size with moderate descensus, (exam limited by habitus)  cervix without lesions or motion tenderness; adnexae-no tenderness or masses Extremities:  no clubbing, cyanosis or edema   Assesment: Post-menopausal Bleeding   Disposition:  A discussion was held with patient regarding the indication for her procedure(s) along with the risks, which include but are not limited to: reaction to anesthesia, damage to adjacent organs, infection and excessive bleeding. The patient verbalized understanding of these risks and has consented to proceed with Hysteroscopy, Dilatation and Curettage at [facility] on September 17, 2013.   CSN# [id number]   Sanari Offner J. Florene Glen, PA-C  for Dr. Harvie Bridge. Mancel Bale

## 2013-09-17 NOTE — Op Note (Signed)
Preop Diagnosis: Post Menopausal Bleeding   Postop Diagnosis: Post Menopausal Bleeding   Procedure: 1.DILATATION & CURETTAGE/HYSTEROSCOPY 2.CYSTOSCOPY  Anesthesia: Choice   Anesthesiologist: Lyndle Herrlich, MD   Attending: Delice Lesch, MD   Assistant: N/a  Findings: Atrophic endometrium  Pathology: Endometrial Curettings  Fluids: 1100 cc  Fluid Deficit: 100 - 200 cc  UOP: 30 cc, pt voided prior to procedure  EBL: Minimal  Complications: None  Procedure: The patient was taken to the operating room after the risks, benefits and alternatives were discussed with the patient. The patient verbalized understanding and consent signed and witnessed. The patient was placed under general anesthesia with an LMA per anesthesiologist and prepped and draped in the normal sterile fashion.  Time Out was performed per protocol.  A bivalve speculum was placed in the patient's vagina and the anterior lip of the cervix was grasped with a single tooth tenaculum. A paracervical block was administered using a total of 10 cc of 2% lidocaine. The cervix was narrow and small dilators were used.  Upon placing the hysteroscope, there was a false passage noted that extended about 1cm. The uterus was not sounded but appeared to be about 4-5cm. The cervix was dilated with the hysteroscope in order to follow the cavity.  The cavity appeared atrophic with bilateral fallopian tube ostia visualized. The cervix was dilated further and light curettage was performed. The hysteroscope was introduced into the uterine cavity again and no areas of perforation noted but fluid deficit rose.  The actual fluid used out of bag per report was 900 cc and there was about 600 cc in the suction cannister.  There was about 100 cc on the floor.  Total fluid deficit calculated to be 200 cc.  There was a question of being almost 200cc on the floor which would then make the deficit 100 cc.  The findings are as noted above. There was  some blood noted at the urethral opening and secondary to c/o PMB, cytoscopy was performed to make sure there was no bladder etiology of bleeding.  Bladder appeared to be within normal limits.  No erythematous areas and blood only noted at the intoitus.  All instruments were removed. Sponge lap and needle count was correct. The patient tolerated the procedure well and was returned to the recovery room in good condition.

## 2013-09-17 NOTE — Interval H&P Note (Signed)
History and Physical Interval Note:  09/17/2013 12:22 PM  Kaylee Carpenter  has presented today for surgery, with the diagnosis of Post Menopausal Bleeding  The various methods of treatment have been discussed with the patient and family. After consideration of risks, benefits and other options for treatment, the patient has consented to  Procedure(s): DILATATION & CURETTAGE/HYSTEROSCOPY WITH POSSIBLE RESECTOSCOPE (N/A) as a surgical intervention .  The patient's history has been reviewed, patient examined, no change in status, stable for surgery.  I have reviewed the patient's chart and labs.  Questions were answered to the patient's satisfaction.     Delice Lesch

## 2013-09-19 ENCOUNTER — Encounter (HOSPITAL_COMMUNITY): Payer: Self-pay | Admitting: Obstetrics and Gynecology

## 2014-08-05 IMAGING — CT CT PELVIS W/ CM
3 series · 13 of 36 positions shown, 19 images · IV contrast (READICAT & [ID] OMNI 300)
Comparison: None.

CLINICAL DATA: Right-sided pain, lower extremity edema bilaterally

CT PELVIS WITH CONTRAST
TECHNIQUE: Multidetector CT imaging of the pelvis was performed
using the standard protocol following the bolus administration of
intravenous contrast.
Contrast:   100 ml Ymnipaque-ZPP

[Series 3: routine pelvis · axial · 0.86mm/px · z∈[-304,-134]mm · 5 of 52 slices shown, 10 images]
[im 9/52  soft-tissue]
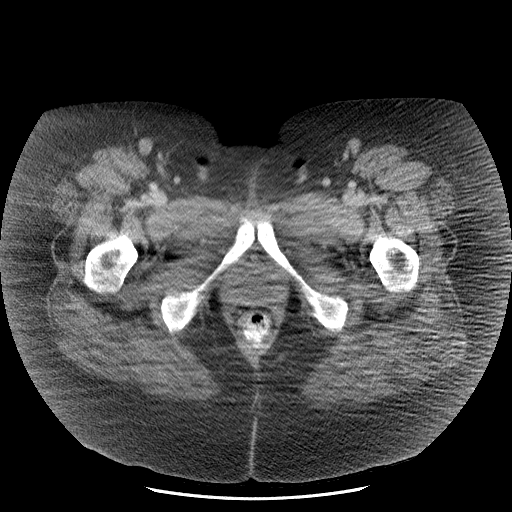
[im 9/52  bone]
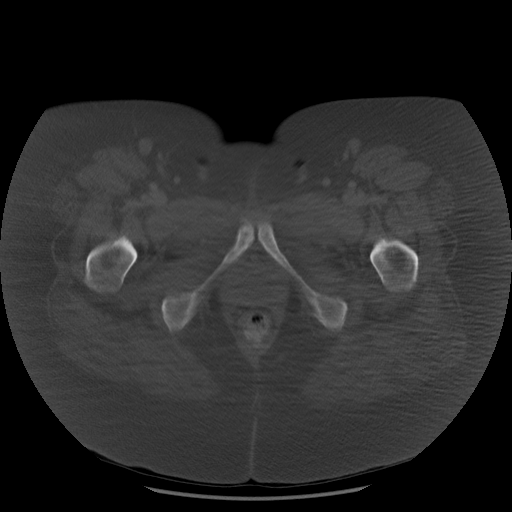
[im 18/52  soft-tissue]
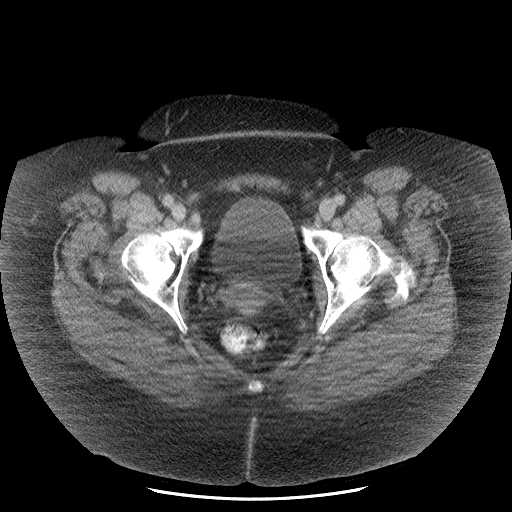
[im 18/52  lung]
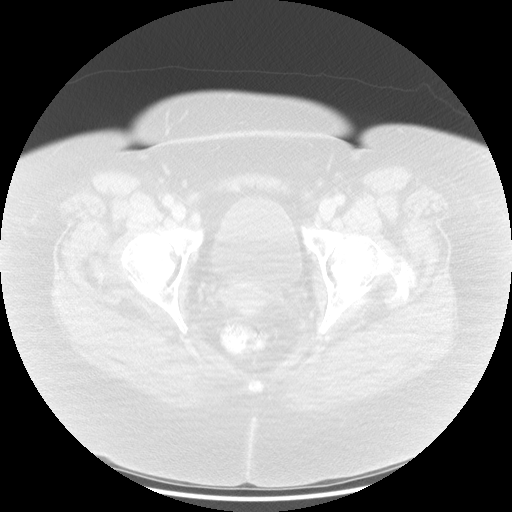
[im 26/52  soft-tissue]
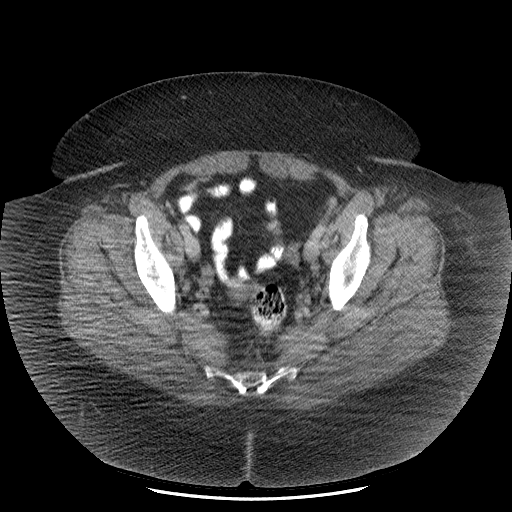
[im 26/52  lung]
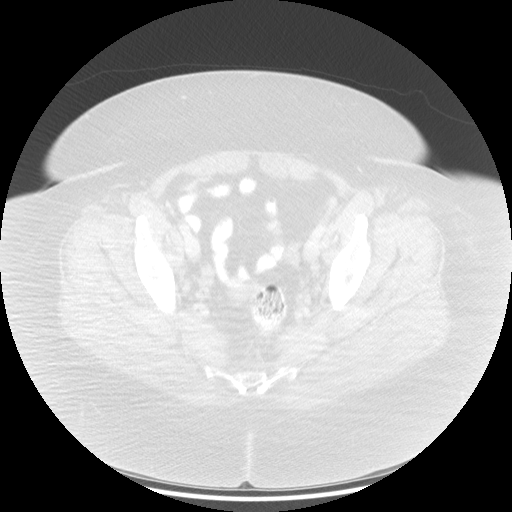
[im 35/52  soft-tissue]
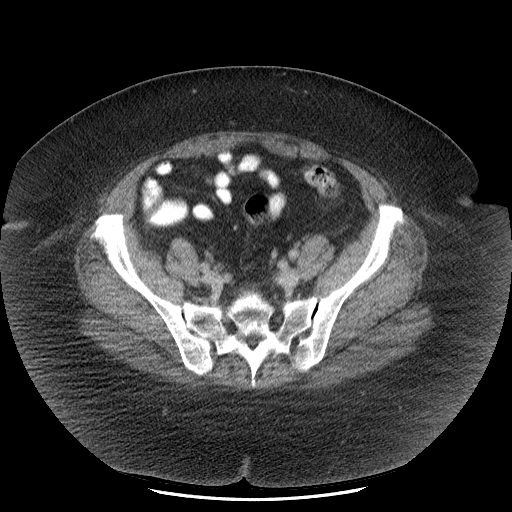
[im 35/52  lung]
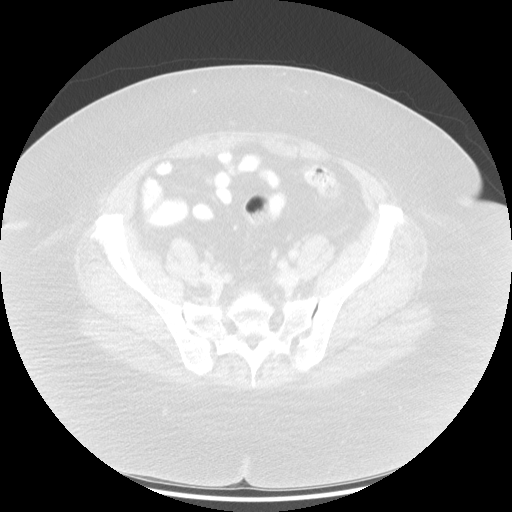
[im 43/52  soft-tissue]
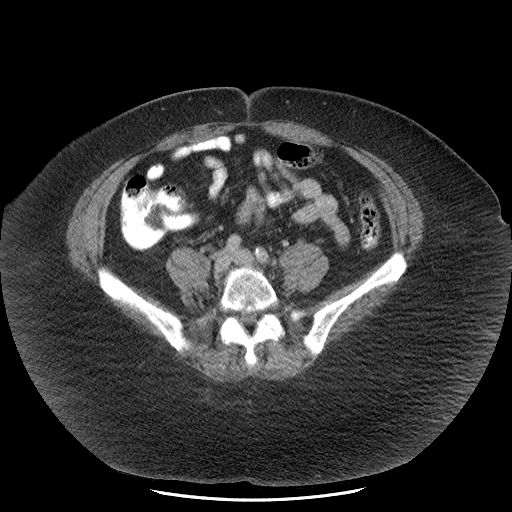
[im 43/52  lung]
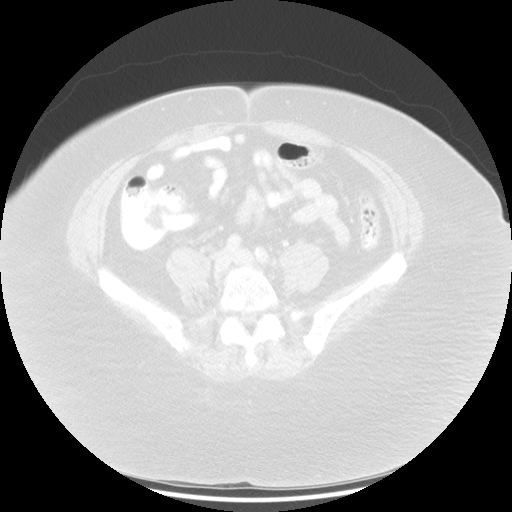

[Series 601: coronal body · coronal · 0.86mm/px · 1 of 156 slices shown, 2 images]
[im 52/156  soft-tissue]
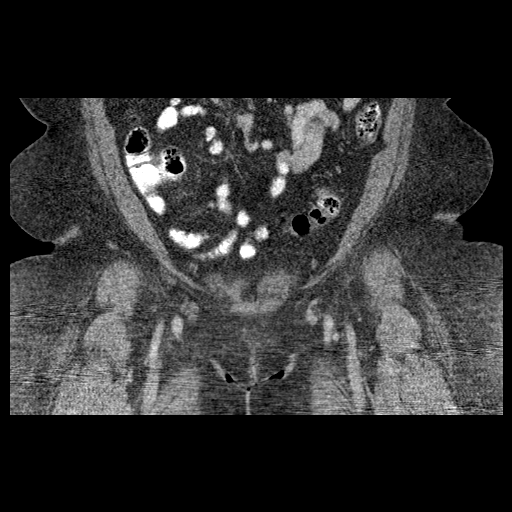
[im 52/156  bone]
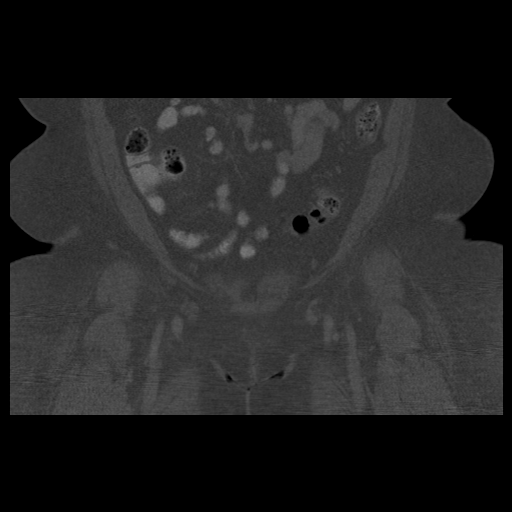

[Series 602: sagittal body · sagittal · 0.86mm/px · 7 of 176 slices shown]
[im 15/176  soft-tissue]
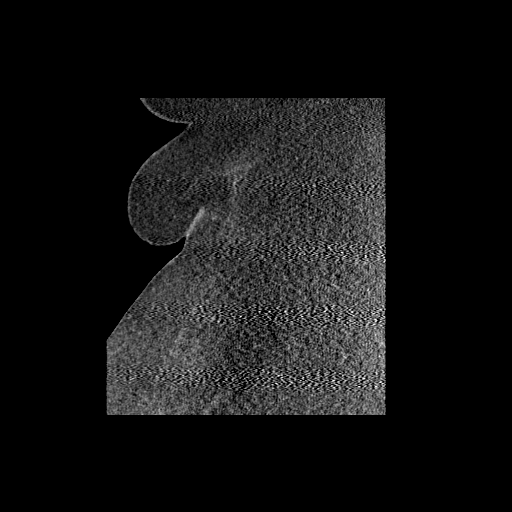
[im 37/176  soft-tissue]
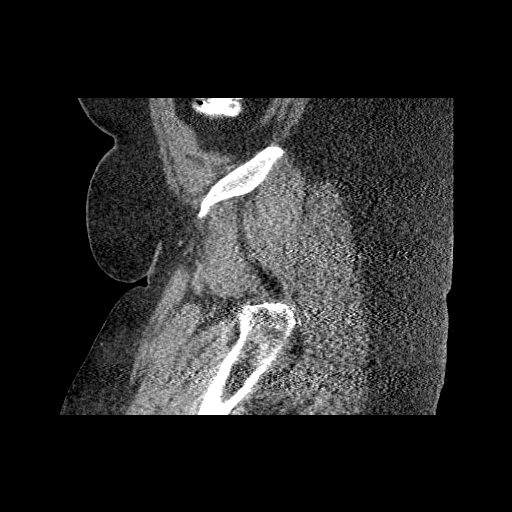
[im 59/176  soft-tissue]
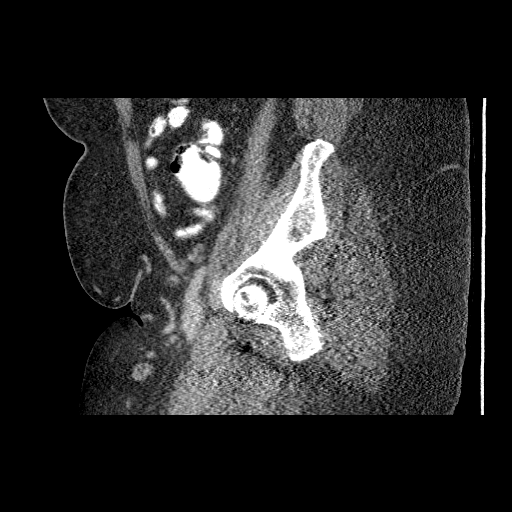
[im 81/176  soft-tissue]
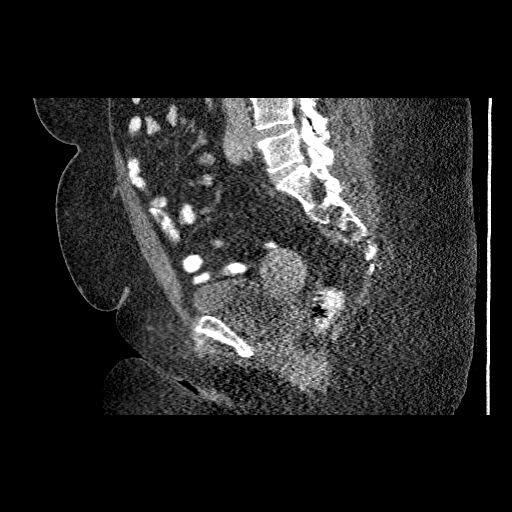
[im 95/176  soft-tissue]
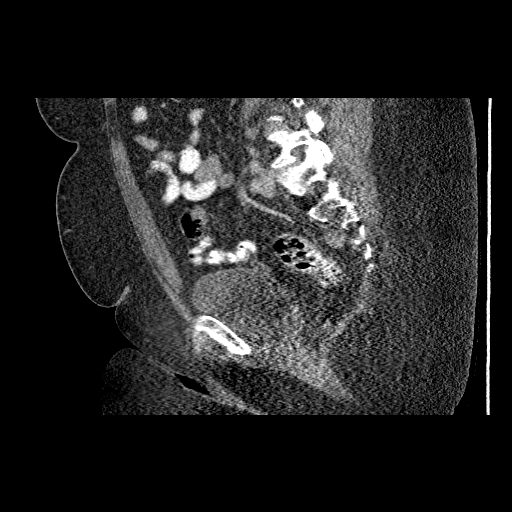
[im 117/176  soft-tissue]
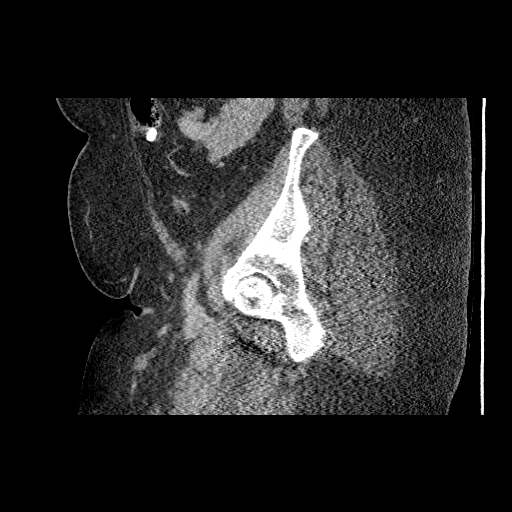
[im 139/176  soft-tissue]
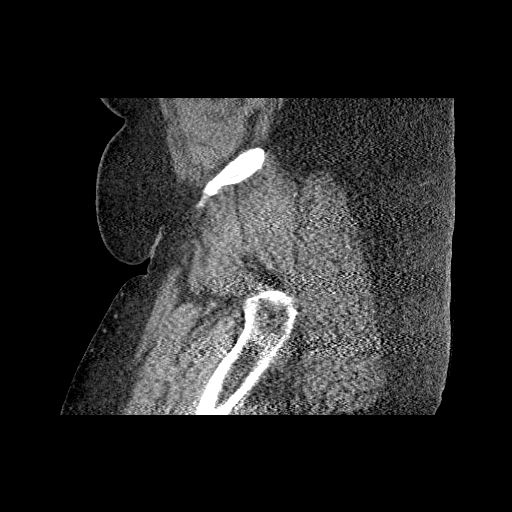

[13 of 36 positions shown; findings below may reference images not displayed]

FINDINGS: The urinary bladder is unremarkable.  The uterus is
normal in size.  No adnexal lesion is seen.  No fluid is noted
within the pelvis.  There are rectosigmoid colonic diverticula
primarily in the descending and proximal sigmoid colon.  The
terminal ileum is unremarkable.  No inguinal hernia is seen.  No
pelvic mass or adenopathy is noted.  No bony abnormality is noted.
Degenerative change is present within the SI joints and within the
facet joints of the lower lumbar spine.
IMPRESSION: 1.  No pelvic mass or adenopathy.

2.  No inguinal hernia is seen.

3.  Diverticula within the rectosigmoid and distal descending colon

## 2014-08-23 ENCOUNTER — Emergency Department (HOSPITAL_BASED_OUTPATIENT_CLINIC_OR_DEPARTMENT_OTHER)
Admission: EM | Admit: 2014-08-23 | Discharge: 2014-08-23 | Disposition: A | Discharge status: Home / Self Care | Payer: BLUE CROSS/BLUE SHIELD | Attending: Emergency Medicine | Admitting: Emergency Medicine

## 2014-08-23 ENCOUNTER — Encounter (HOSPITAL_BASED_OUTPATIENT_CLINIC_OR_DEPARTMENT_OTHER): Payer: Self-pay | Admitting: *Deleted

## 2014-08-23 DIAGNOSIS — I10 Essential (primary) hypertension: Secondary | ICD-10-CM | POA: Diagnosis not present

## 2014-08-23 DIAGNOSIS — Z9981 Dependence on supplemental oxygen: Secondary | ICD-10-CM | POA: Insufficient documentation

## 2014-08-23 DIAGNOSIS — Z91040 Latex allergy status: Secondary | ICD-10-CM | POA: Insufficient documentation

## 2014-08-23 DIAGNOSIS — Z8639 Personal history of other endocrine, nutritional and metabolic disease: Secondary | ICD-10-CM | POA: Diagnosis not present

## 2014-08-23 DIAGNOSIS — L03113 Cellulitis of right upper limb: Secondary | ICD-10-CM

## 2014-08-23 DIAGNOSIS — L089 Local infection of the skin and subcutaneous tissue, unspecified: Secondary | ICD-10-CM | POA: Diagnosis present

## 2014-08-23 DIAGNOSIS — G4730 Sleep apnea, unspecified: Secondary | ICD-10-CM | POA: Insufficient documentation

## 2014-08-23 DIAGNOSIS — Z79899 Other long term (current) drug therapy: Secondary | ICD-10-CM | POA: Insufficient documentation

## 2014-08-23 MED ORDER — HYDROXYZINE HCL 10 MG PO TABS
10.0000 mg | ORAL_TABLET | Freq: Once | ORAL | Status: AC
  Administered 2014-08-23: 25 mg via ORAL
  Filled 2014-08-23: qty 1

## 2014-08-23 MED ORDER — SULFAMETHOXAZOLE-TRIMETHOPRIM 800-160 MG PO TABS: 1.0000 | ORAL_TABLET | Freq: Once | ORAL | Status: DC

## 2014-08-23 MED ORDER — HYDROXYZINE HCL 10 MG PO TABS: 10.0000 mg | ORAL_TABLET | Freq: Three times a day (TID) | ORAL | Status: DC | PRN

## 2014-08-23 MED ORDER — SULFAMETHOXAZOLE-TRIMETHOPRIM 800-160 MG PO TABS
1.0000 | ORAL_TABLET | Freq: Once | ORAL | Status: AC
  Administered 2014-08-23: 1 via ORAL
  Filled 2014-08-23: qty 1

## 2014-08-23 MED ORDER — HYDROXYZINE HCL 25 MG PO TABS
ORAL_TABLET | ORAL | Status: AC
  Filled 2014-08-23: qty 1

## 2014-08-23 NOTE — Discharge Instructions (Signed)
As discussed, you likely have competitions related to your insect bite, likely cellulitis or skin infection.  Please take all medication as directed, and do not hesitate to return if you develop new, or concerning changes in your condition.    Otherwise, please follow-up with your primary care physician in the next 3-5 days.

## 2014-08-23 NOTE — ED Notes (Signed)
Possible insect bite to her right upper arm 2 days ago. Arm is painful, swollen, red and itching.

## 2014-08-23 NOTE — ED Provider Notes (Signed)
CSN: [id number]     Arrival date & time 08/23/14  1138 History   First MD Initiated Contact with Patient 08/23/14 1151     Chief Complaint  Patient presents with  . Insect Bite    HPI  Patient presents with concern of a right upper arm lesion. Patient states that she was walking outside 2 days ago, felt multiple bites of her right upper posterior arm. Since that time she's had increasing erythema, warmth, pain in the arm. Symptoms have progressed in spite of using topical hydrocortisone, Benadryl. No fever, chills, nausea, vomiting, or complaints.   Past Medical History  Diagnosis Date  . Hypertension   . Sleep apnea     uses a CPAP  . Shortness of breath     due to edema and weight  . Hyperthyroidism 2007    Graves Disease   Past Surgical History  Procedure Laterality Date  . Dilation and curettage of uterus    . Cholecystectomy  1985  . Dilatation & currettage/hysteroscopy with resectocope N/A 09/17/2013    Procedure: DILATATION & CURETTAGE, HYSTEROSCOPY, CYSTOSCOPY;  Surgeon: Delice Lesch, MD;  Location: [facility];  Service: Gynecology;  Laterality: N/A;   No family history on file. History  Substance Use Topics  . Smoking status: Never Smoker   . Smokeless tobacco: Not on file  . Alcohol Use: No   OB History    No data available     Review of Systems  Constitutional: Negative for fever.  Respiratory: Negative for shortness of breath.   Cardiovascular: Negative for chest pain.  Musculoskeletal:       Negative aside from HPI  Skin:       Negative aside from HPI  Allergic/Immunologic: Negative for immunocompromised state.  Neurological: Negative for weakness.      Allergies  Latex  Home Medications   Prior to Admission medications   Medication Sig Start Date End Date Taking? Authorizing Provider  cholecalciferol (VITAMIN D) 1000 UNITS tablet Take 1,000 Units by mouth daily.    Historical Provider, MD  HYDROcodone-acetaminophen (NORCO/VICODIN) 5-325 MG  per tablet Take 1 tablet by mouth every 6 (six) hours as needed for moderate pain. 09/17/13   Everett Graff, MD  ibuprofen (ADVIL,MOTRIN) 600 MG tablet Take 1 tablet (600 mg total) by mouth every 6 (six) hours as needed. 09/17/13   Everett Graff, MD  losartan (COZAAR) 25 MG tablet Take 25 mg by mouth daily.    Historical Provider, MD  triamterene-hydrochlorothiazide (MAXZIDE) 75-50 MG per tablet Take 1 tablet by mouth daily.    Historical Provider, MD   BP 156/101 mmHg  Pulse 68  Temp(Src) 97.7 F (36.5 C) (Oral)  Resp 20  Ht 5\' 6"  (1.676 m)  Wt 322 lb (146.058 kg)  BMI 52.00 kg/m2  SpO2 96% Physical Exam  Constitutional: She is oriented to person, place, and time. She appears well-developed and well-nourished. No distress.  HENT:  Head: Normocephalic and atraumatic.  Eyes: Conjunctivae and EOM are normal.  Cardiovascular: Normal rate and regular rhythm.   Pulmonary/Chest: Effort normal and breath sounds normal. No stridor. No respiratory distress.  Abdominal: She exhibits no distension.  Musculoskeletal: She exhibits no edema.       Right shoulder: Normal.       Right elbow: Normal. Neurological: She is alert and oriented to person, place, and time. No cranial nerve deficit.  Skin: Skin is warm and dry.     Psychiatric: She has a normal mood and affect.  Nursing note and vitals reviewed.   ED Course  Procedures (including critical care time)   MDM  Patient presents with likely cellulitis complicated insect bites. Patient has no evidence for bacteremia or sepsis. Patient tolerated initial antibiotics, Atarax well, was discharged in stable condition with home therapy.   Carmin Muskrat, MD 08/23/14 1226

## 2015-12-26 ENCOUNTER — Encounter: Payer: Self-pay | Admitting: Neurology

## 2015-12-26 ENCOUNTER — Ambulatory Visit (INDEPENDENT_AMBULATORY_CARE_PROVIDER_SITE_OTHER): Payer: BLUE CROSS/BLUE SHIELD | Admitting: Neurology

## 2015-12-26 VITALS — BP 140/78 | HR 78 | Resp 16 | Ht 66.0 in | Wt 337.0 lb

## 2015-12-26 DIAGNOSIS — G4700 Insomnia, unspecified: Secondary | ICD-10-CM | POA: Diagnosis not present

## 2015-12-26 DIAGNOSIS — G4710 Hypersomnia, unspecified: Secondary | ICD-10-CM

## 2015-12-26 DIAGNOSIS — G4761 Periodic limb movement disorder: Secondary | ICD-10-CM | POA: Diagnosis not present

## 2015-12-26 DIAGNOSIS — G4730 Sleep apnea, unspecified: Secondary | ICD-10-CM | POA: Diagnosis not present

## 2015-12-26 DIAGNOSIS — G4733 Obstructive sleep apnea (adult) (pediatric): Secondary | ICD-10-CM | POA: Diagnosis not present

## 2015-12-26 DIAGNOSIS — I10 Essential (primary) hypertension: Secondary | ICD-10-CM | POA: Diagnosis not present

## 2015-12-26 DIAGNOSIS — R0683 Snoring: Secondary | ICD-10-CM

## 2015-12-26 DIAGNOSIS — E669 Obesity, unspecified: Secondary | ICD-10-CM

## 2015-12-26 NOTE — Progress Notes (Signed)
SLEEP MEDICINE CLINIC   Provider:  Larey Seat, M D  Referring Provider: Lin Landsman, MD Primary Care Physician:  Haywood Pao, MD  Chief Complaint  Patient presents with  . Sleep Apnea    Rm 11. Sleep sonsult. Patient had sleep study in 2012. Dx with sleep apnea. She has not used CPAP in a year, her DME company closed so she was unable to get new supplies.  She brought her CPAP, but unable to get a download.     HPI:  Kaylee Carpenter is a 60 y.o. female , seen here as a referral from Dr. Osborne Casco, to establish sleep medicine care.  Kaylee Carpenter has a history of lower extremity edema, obesity, diagnosed apnea on CPAP, history of pneumonia, goiter with hyperthyroidism and Graves' disease diagnosed in 2008, iron deficiency anemia, hyperlipidemia, peripheral vascular disease. She remembers that she was diagnosed with obstructive sleep apnea at the [facility] approximately 2012. She received a CPAP machine which we will try to download today. She has been using CPAP continuously until 2015 after being diagnosed and prescribed CPAP. I do not have access to her original sleep study today. Dr Osborne Casco called her previous PCP and could not obtain her records pertaining the sleep study either.  She urgently needs new supplies. She has not used the machine in over a year. She reports that CPAP made a big difference for her she felt better, more alert her sleep was less fragmented.  Sleep habits are as follows: She goes to bed between 10 PM and 3 AM in the morning in a wide variable timeframe, the patient works from home between  12:30 and 8:30 PM. She retreats to her bedroom early but watches TV in the bedroom. Her bedroom is cool, she sleeps alone. She prefers sleeping on her side on 3 pillows. The TV runs all night !  Her sleep time varies depending on what is on TV. She reports that if she falls asleep between 10 and 11 PM she will be awake by 3 or 4 AM. If she  goes to bed later for example 2 AM she can sleep until 7 or 8 AM. She only gets 5 hours of sleep on average. She wakes up spontaneously not by alarm. She is taking a diuretic and keeps hydrating herself well, and will have up to 4 or 5 bathroom breaks at night. These further fragmented sleep. When she wakes up in the morning she does not have a headache and she does not have a very dry mouth.   Sleep medical history and family sleep history: No childhood history of sleep walking or night terrors, no enuresis. No history of traumatic brain injury, neck injury, neck surgery.  ENT procedures: She underwent tonsillectomy at age 65 -none since. Her father was diagnosed with obstructive sleep apnea and loud snoring, has been using CPAP.  Social history:  Remote history of shift working for about 2-3 years, but not recent. Works from home. Fluid sleep times. She may drink 2-3 glasses of iced tea a day, coffee rarely soda. She has never used tobacco in any form. He does not drink alcohol.  Review of Systems: Out of a complete 14 system review, the patient complains of only the following symptoms, and all other reviewed systems are negative. Dizziness when sleeping without 3 pillows, snoring, apneas witnessed.   How likely are you to doze in the following situations: 0 = not likely, 1 = slight chance, 2 =  moderate chance, 3 = high chance  Sitting and Reading? 1 Watching Television? 3 Sitting inactive in a public place (theater or meeting)?2 As a passenger in a car for an hour without a break? 1-2 Lying down in the afternoon when circumstances permit?1 Sitting and talking to someone? 0 Sitting quietly after lunch without alcohol?2 In a car, while stopped for a few minutes in traffic? 0   Total =11    , Fatigue severity score: 44 , depression score; 1/15    Social History   Social History  . Marital status: Divorced    Spouse name: N/A  . Number of children: 2  . Years of education: BS    Occupational History  . Not on file.   Social History Main Topics  . Smoking status: Never Smoker  . Smokeless tobacco: Never Used  . Alcohol use No  . Drug use: No  . Sexual activity: Not on file   Other Topics Concern  . Not on file   Social History Narrative   Drinks about 1-2 sodas a week.     Family History  Problem Relation Age of Onset  . Hypertension Mother   . Colon cancer Father     Past Medical History:  Diagnosis Date  . Hypertension   . Hyperthyroidism 2007   Graves Disease  . PVD (peripheral vascular disease) ([place])   . Shortness of breath    due to edema and weight  . Sleep apnea    uses a CPAP    Past Surgical History:  Procedure Laterality Date  . CHOLECYSTECTOMY  1985  . DILATATION & CURRETTAGE/HYSTEROSCOPY WITH RESECTOCOPE N/A 09/17/2013   Procedure: DILATATION & CURETTAGE, HYSTEROSCOPY, CYSTOSCOPY;  Surgeon: Delice Lesch, MD;  Location: [facility];  Service: Gynecology;  Laterality: N/A;  . DILATION AND CURETTAGE OF UTERUS      Current Outpatient Prescriptions  Medication Sig Dispense Refill  . carvedilol (COREG) 12.5 MG tablet     . furosemide (LASIX) 40 MG tablet     . potassium chloride SA (K-DUR,KLOR-CON) 20 MEQ tablet      No current facility-administered medications for this visit.     Allergies as of 12/26/2015 - Review Complete 12/26/2015  Allergen Reaction Noted  . Latex  01/07/2012    Vitals: Resp 16   Ht 5\' 6"  (1.676 m)   Wt (!) 337 lb (152.9 kg)   BMI 54.39 kg/m  Last Weight:  Wt Readings from Last 1 Encounters:  12/26/15 (!) 337 lb (152.9 kg)   [id number] mass index is 54.39 kg/m.     Last Height:   Ht Readings from Last 1 Encounters:  12/26/15 5\' 6"  (1.676 m)    Physical exam:  General: The patient is awake, alert and appears not in acute distress. The patient is well groomed. Head: Normocephalic, atraumatic. Neck is supple. Mallampati 3,  neck circumference:17. Nasal airflow  unrestricted . Retrognathia is  not seen.  Cardiovascular:  Regular rate and rhythm, without  murmurs or carotid bruit, and without distended neck veins. Respiratory: Lungs are clear to auscultation. Skin:  Without evidence of edema, or rash Trunk: BMI is morbidly obese . The patient's posture is stooped.    Neurologic exam : The patient is awake and alert, oriented to place and time.   Memory subjective described as intact Attention span & concentration ability appears normal.  Speech is fluent,  without dysarthria, dysphonia or aphasia.  Mood and affect are appropriate.  Cranial nerves: Pupils are equal  and briskly reactive to light. Funduscopic exam without evidence of pallor or edema.  Extraocular movements  in vertical and horizontal planes intact and without nystagmus. Visual fields by finger perimetry are intact. Hearing to finger rub intact.  Facial sensation intact to fine touch. Facial motor strength is symmetric and tongue and uvula move midline. Shoulder shrug was symmetrical.   Motor exam:  Normal tone, muscle bulk and symmetric strength in all extremities.  Sensory:  Fine touch, pinprick and vibration -Proprioception tested in the upper extremities was normal.  Coordination: Finger-to-nose maneuver without evidence of ataxia, dysmetria or tremor.  Gait and station: Patient walks without assistive device and is able unassisted to climb up to the exam table. Strength within normal limits.  Stance is stable and normal.  Turns with  3-4 Steps. Romberg testing is negative.  Deep tendon reflexes: in the upper and lower extremities are symmetric and intact. Babinski maneuver response is downgoing.   I was able to review a download for the patient's CPAP machine her average AHI was 0.5 on 10 cm water pressure but she was only able to use the machine 4 days out of the last 90. This is because of lack of supplies. She urgently needs filter, tubing and mask. In addition I was able to review the laboratory results  from [place] office, dated 36 of November 2017. Normal comprehensive metabolic panel, normal CBC and differential, elevated cholesterol 230 ng, and LDL to HDL ratio was 3.1. Ferritin was 110.2 ng, her GFR was in normal range.  The patient was advised of the nature of the diagnosed sleep disorder , the treatment options and risks for general a health and wellness arising from not treating the condition.  I spent more than 45 minutes of face to face time with the patient. Greater than 50% of time was spent in counseling and coordination of care. We have discussed the diagnosis and differential and I answered the patient's questions.     Assessment:  After physical and neurologic examination, review of laboratory studies,  Personal review of imaging studies, reports of other /same  Imaging studies ,  Results of polysomnography/ neurophysiology testing and pre-existing records as far as provided in visit., my assessment is   1)  Mrs. Iiams was diagnosed with obstructive sleep apnea, but we do not know her original severity of apnea. She is continued to gain weight. She has continued to have problems with hypertension and has been placed on fluid pills. She takes a fluid pills in the evening because she doesn't want to have bathroom breaks while working from home. This causes her to have nocturia up to 5 times at night. Nocturia may also be caused in obstructive sleep apnea, which I consider at this time untreated. Other risk factors are superobesity, larger than average neck circumference, small oropharyngeal opening.   2) there are several sleep hygiene issues to discuss as well. The patient should eliminate the TV from her bedroom but has never been used to sleep with out a TV in the background. I would like her to switch to an audio bulk or some kind of the laxation techniques that does not involve screen light. I also would recommend that she tries melatonin to sleep longer. I will invite her  for a split night polysomnography to diagnose the current degree of apnea, she is likely due for a new CPAP machine as hers is now 5 years or older. And we will order supplies depending on the new machine  type. I will order is a split-night polysomnography before December 31.  Insomnia behavior changes discussed, the patient reports PLMs, not RLS    Plan:  Treatment plan and additional workup :  SPlit night polysomnography in established CPAP user with super obesity ( BMI 54) , PLM, nocturia, HTN and insomnia- sleep deprivation , partially due to poor sleep habits.   RV after Sleep study.    Asencion Partridge Amberlynn Tempesta MD  12/26/2015   CC: Dr Osborne Casco.

## 2015-12-26 NOTE — Patient Instructions (Signed)
Please remember to try to maintain good sleep hygiene, which means: Keep a regular sleep and wake schedule, try not to exercise or have a meal within 2 hours of your bedtime, try to keep your bedroom conducive for sleep, that is, cool and dark, without light distractors such as an illuminated alarm clock, and refrain from watching TV right before sleep or in the middle of the night and do not keep the TV or radio on during the night. Also, try not to use or play on electronic devices at bedtime, such as your cell phone, tablet PC or laptop. If you like to read at bedtime on an electronic device, try to dim the background light as much as possible. Do not eat in the middle of the night.   We will request a sleep study to [place] your current degree of sleep apnea.     We will look for leg twitching and snoring or sleep apnea.   For chronic insomnia, you are best followed by a psychiatrist and/or sleep psychologist.   We will call you with the sleep study results and make a follow up appointment if needed.

## 2016-02-07 ENCOUNTER — Other Ambulatory Visit: Payer: Self-pay | Admitting: Gastroenterology

## 2016-02-23 NOTE — H&P (Signed)
Kaylee Carpenter HPI: This 61 year old black female presents to the office for colorectal cancer screening. She has 1-4 BM's per day with no obvious blood or mucus in the stool. She has a lot of gas and bloating. She has good appetite and she has gained 38 pounds over the last 5 years. She denies having any complaints of abdominal pain, nausea, vomiting, acid reflux, dysphagia or odynophagia. She denies having a family history of celiac sprue or IBD. Her father was diagnosed with colon cancer in his 60's. Her last colonoscopy done on 01/12/2011 revealed a few scattered diverticula but the exam was otherwise normal.  Past Medical History:  Diagnosis Date  . Hypertension   . Hyperthyroidism 2007   Graves Disease  . PVD (peripheral vascular disease) ([place])   . Shortness of breath    due to edema and weight  . Sleep apnea    uses a CPAP    Past Surgical History:  Procedure Laterality Date  . CHOLECYSTECTOMY  1985  . DILATATION & CURRETTAGE/HYSTEROSCOPY WITH RESECTOCOPE N/A 09/17/2013   Procedure: DILATATION & CURETTAGE, HYSTEROSCOPY, CYSTOSCOPY;  Surgeon: Delice Lesch, MD;  Location: [facility];  Service: Gynecology;  Laterality: N/A;  . DILATION AND CURETTAGE OF UTERUS      Family History  Problem Relation Age of Onset  . Hypertension Mother   . Colon cancer Father     Social History:  reports that she has never smoked. She has never used smokeless tobacco. She reports that she does not drink alcohol or use drugs.  Allergies:  Allergies  Allergen Reactions  . Latex Other (See Comments)    Top layer of skin came off    Medications: Scheduled: Continuous:  No results found for this or any previous visit (from the past 24 hour(s)).   No results found.  ROS:  As stated above in the HPI otherwise negative.  There were no vitals taken for this visit.    PE: Gen: NAD, Alert and Oriented HEENT:  Shorter/AT, EOMI Neck: Supple, no LAD Lungs: CTA Bilaterally CV: RRR without  M/G/R ABM: Soft, NTND, +BS Ext: No C/C/E  Assessment/Plan: 1) Screening. 2) Family history of colon cancer.  Kieren Adkison D 02/23/2016, 11:35 AM

## 2016-02-24 ENCOUNTER — Encounter (HOSPITAL_COMMUNITY)
Admission: RE | Disposition: A | Discharge status: Home / Self Care | Payer: Self-pay | Source: Ambulatory Visit | Attending: Gastroenterology

## 2016-02-24 ENCOUNTER — Ambulatory Visit (HOSPITAL_COMMUNITY): Payer: BLUE CROSS/BLUE SHIELD | Admitting: Anesthesiology

## 2016-02-24 ENCOUNTER — Ambulatory Visit (HOSPITAL_COMMUNITY)
Admission: RE | Admit: 2016-02-24 | Discharge: 2016-02-24 | Disposition: A | Discharge status: Home / Self Care | Payer: BLUE CROSS/BLUE SHIELD | Source: Ambulatory Visit | Attending: Gastroenterology | Admitting: Gastroenterology

## 2016-02-24 ENCOUNTER — Encounter (HOSPITAL_COMMUNITY): Payer: Self-pay

## 2016-02-24 DIAGNOSIS — G4730 Sleep apnea, unspecified: Secondary | ICD-10-CM | POA: Diagnosis not present

## 2016-02-24 DIAGNOSIS — Z800 Family history of malignant neoplasm of digestive organs: Secondary | ICD-10-CM | POA: Insufficient documentation

## 2016-02-24 DIAGNOSIS — D123 Benign neoplasm of transverse colon: Secondary | ICD-10-CM | POA: Diagnosis not present

## 2016-02-24 DIAGNOSIS — E0500 Thyrotoxicosis with diffuse goiter without thyrotoxic crisis or storm: Secondary | ICD-10-CM | POA: Diagnosis not present

## 2016-02-24 DIAGNOSIS — K5730 Diverticulosis of large intestine without perforation or abscess without bleeding: Secondary | ICD-10-CM | POA: Insufficient documentation

## 2016-02-24 DIAGNOSIS — I10 Essential (primary) hypertension: Secondary | ICD-10-CM | POA: Insufficient documentation

## 2016-02-24 DIAGNOSIS — I739 Peripheral vascular disease, unspecified: Secondary | ICD-10-CM | POA: Diagnosis not present

## 2016-02-24 DIAGNOSIS — Z1211 Encounter for screening for malignant neoplasm of colon: Secondary | ICD-10-CM | POA: Insufficient documentation

## 2016-02-24 HISTORY — PX: COLONOSCOPY WITH PROPOFOL: SHX5780

## 2016-02-24 SURGERY — COLONOSCOPY WITH PROPOFOL
Anesthesia: Monitor Anesthesia Care

## 2016-02-24 MED ORDER — PROPOFOL 10 MG/ML IV BOLUS
INTRAVENOUS | Status: AC
  Filled 2016-02-24: qty 40

## 2016-02-24 MED ORDER — LACTATED RINGERS IV SOLN
INTRAVENOUS | Status: DC
  Administered 2016-02-24: 1000 mL via INTRAVENOUS

## 2016-02-24 MED ORDER — SODIUM CHLORIDE 0.9 % IV SOLN: INTRAVENOUS | Status: DC

## 2016-02-24 MED ORDER — PROPOFOL 10 MG/ML IV BOLUS
INTRAVENOUS | Status: DC | PRN
  Administered 2016-02-24 (×11): 20 mg via INTRAVENOUS
  Administered 2016-02-24: 40 mg via INTRAVENOUS
  Administered 2016-02-24 (×6): 20 mg via INTRAVENOUS

## 2016-02-24 SURGICAL SUPPLY — 21 items

## 2016-02-24 NOTE — Transfer of Care (Signed)
Immediate Anesthesia Transfer of Care Note  Patient: Kaylee Carpenter  Procedure(s) Performed: Procedure(s): COLONOSCOPY WITH PROPOFOL (N/A)  Patient Location: PACU  Anesthesia Type:MAC  Level of Consciousness: sedated  Airway & Oxygen Therapy: Patient Spontanous Breathing and Patient connected to nasal cannula oxygen  Post-op Assessment: Report given to RN and Post -op Vital signs reviewed and stable  Post vital signs: Reviewed and stable  Last Vitals:  Vitals:   02/24/16 0811  BP: (!) 166/87  Pulse: 60  Resp: 14  Temp: 36.6 C    Last Pain:  Vitals:   02/24/16 0811  TempSrc: Oral         Complications: No apparent anesthesia complications

## 2016-02-24 NOTE — Op Note (Signed)
[facility] Patient Name: Kaylee Carpenter Procedure Date: 02/24/2016 MRN: [id number] Attending MD: Carol Ada , MD Date of Birth: May 03, 1955 CSN: [id number] Age: 61 Admit Type: Outpatient Procedure:                Colonoscopy Indications:              Screening for colorectal malignant neoplasm Providers:                Carol Ada, MD, Hilma Favors, RN, Alfonso Patten,                            Technician, Streator Alday CRNA, CRNA Referring MD:              Medicines:                Propofol per Anesthesia Complications:            No immediate complications. Estimated Blood Loss:     Estimated blood loss: none. Estimated blood loss                            was minimal. Procedure:                Pre-Anesthesia Assessment:                           - Prior to the procedure, a History and Physical                            was performed, and patient medications and                            allergies were reviewed. The patient's tolerance of                            previous anesthesia was also reviewed. The risks                            and benefits of the procedure and the sedation                            options and risks were discussed with the patient.                            All questions were answered, and informed consent                            was obtained. Prior Anticoagulants: The patient has                            taken no previous anticoagulant or antiplatelet                            agents. ASA Grade Assessment: III - A patient with  severe systemic disease. After reviewing the risks                            and benefits, the patient was deemed in                            satisfactory condition to undergo the procedure.                           - Sedation was administered by an anesthesia                            professional. Deep sedation was attained.                           After  obtaining informed consent, the colonoscope                            was passed under direct vision. Throughout the                            procedure, the patient's blood pressure, pulse, and                            oxygen saturations were monitored continuously. The                            EC-[id number] scope was introduced through                            the anus and advanced to the the cecum, identified                            by appendiceal orifice and ileocecal valve. The                            colonoscopy was performed without difficulty. The                            patient tolerated the procedure well. The quality                            of the bowel preparation was excellent. The                            ileocecal valve, appendiceal orifice, and rectum                            were photographed. Scope In: 8:56:40 AM Scope Out: 9:14:57 AM Scope Withdrawal Time: 0 hours 15 minutes 35 seconds  Total Procedure Duration: 0 hours 18 minutes 17 seconds  Findings:      A 3 mm polyp was found in the transverse colon. The polyp was sessile.       The polyp was removed with a cold  snare. Resection and retrieval were       complete.      A few large-mouthed diverticula were found in the sigmoid colon and       ascending colon.      A single small localized angiodysplastic lesion without bleeding was       found in the ascending colon. Impression:               - One 3 mm polyp in the transverse colon, removed                            with a cold snare. Resected and retrieved.                           - Diverticulosis in the sigmoid colon and in the                            ascending colon.                           - A single non-bleeding colonic angiodysplastic                            lesion. Moderate Sedation:      None Recommendation:           - Patient has a contact number available for                            emergencies. The signs  and symptoms of potential                            delayed complications were discussed with the                            patient. Return to normal activities tomorrow.                            Written discharge instructions were provided to the                            patient.                           - Resume previous diet.                           - Continue present medications.                           - Await pathology results.                           - Repeat colonoscopy in 5 years for surveillance. Procedure Code(s):        --- Professional ---                           [phone], Colonoscopy, flexible; with  removal of                            tumor(s), polyp(s), or other lesion(s) by snare                            technique Diagnosis Code(s):        --- Professional ---                           Z12.11, Encounter for screening for malignant                            neoplasm of colon                           D12.3, Benign neoplasm of transverse colon (hepatic                            flexure or splenic flexure)                           K55.20, Angiodysplasia of colon without hemorrhage                           K57.30, Diverticulosis of large intestine without                            perforation or abscess without bleeding CPT copyright 2016 American Medical Association. All rights reserved. The codes documented in this report are preliminary and upon coder review may  be revised to meet current compliance requirements. Carol Ada, MD Carol Ada, MD 02/24/2016 9:20:25 AM This report has been signed electronically. Number of Addenda: 0

## 2016-02-24 NOTE — Anesthesia Preprocedure Evaluation (Signed)
Anesthesia Evaluation  Patient identified by MRN, date of birth, ID band Patient awake    Reviewed: Allergy & Precautions, NPO status , Patient's Chart, lab work & pertinent test results  Airway Mallampati: II  TM Distance: >3 FB Neck ROM: Full    Dental  (+) Teeth Intact, Dental Advisory Given   Pulmonary    breath sounds clear to auscultation       Cardiovascular hypertension,  Rhythm:Regular Rate:Normal     Neuro/Psych    GI/Hepatic   Endo/Other    Renal/GU      Musculoskeletal   Abdominal   Peds  Hematology   Anesthesia Other Findings   Reproductive/Obstetrics                             Anesthesia Physical Anesthesia Plan  ASA: III  Anesthesia Plan: MAC   Post-op Pain Management:    Induction: Intravenous  Airway Management Planned: Nasal Cannula and Natural Airway  Additional Equipment:   Intra-op Plan:   Post-operative Plan:   Informed Consent: I have reviewed the patients History and Physical, chart, labs and discussed the procedure including the risks, benefits and alternatives for the proposed anesthesia with the patient or authorized representative who has indicated his/her understanding and acceptance.   Dental advisory given  Plan Discussed with: CRNA and Anesthesiologist  Anesthesia Plan Comments:         Anesthesia Quick Evaluation

## 2016-02-24 NOTE — Anesthesia Postprocedure Evaluation (Addendum)
Anesthesia Post Note  Patient: Kaylee Carpenter  Procedure(s) Performed: Procedure(s) (LRB): COLONOSCOPY WITH PROPOFOL (N/A)  Patient location during evaluation: Endoscopy Anesthesia Type: MAC Level of consciousness: awake, awake and alert and oriented Pain management: pain level controlled Vital Signs Assessment: post-procedure vital signs reviewed and stable Respiratory status: spontaneous breathing, respiratory function stable and nonlabored ventilation Cardiovascular status: blood pressure returned to baseline Anesthetic complications: no       Last Vitals:  Vitals:   02/24/16 0811 02/24/16 0920  BP: (!) 166/87 121/80  Pulse: 60 75  Resp: 14 13  Temp: 36.6 C 36.4 C    Last Pain:  Vitals:   02/24/16 0920  TempSrc: Oral                 Lyndi Holbein COKER

## 2016-02-24 NOTE — Discharge Instructions (Signed)

## 2016-02-26 ENCOUNTER — Ambulatory Visit (INDEPENDENT_AMBULATORY_CARE_PROVIDER_SITE_OTHER): Payer: BLUE CROSS/BLUE SHIELD | Admitting: Neurology

## 2016-02-26 ENCOUNTER — Encounter (HOSPITAL_COMMUNITY): Payer: Self-pay | Admitting: Gastroenterology

## 2016-02-26 DIAGNOSIS — G4733 Obstructive sleep apnea (adult) (pediatric): Secondary | ICD-10-CM

## 2016-02-26 DIAGNOSIS — G4700 Insomnia, unspecified: Secondary | ICD-10-CM

## 2016-02-26 DIAGNOSIS — E669 Obesity, unspecified: Secondary | ICD-10-CM

## 2016-02-26 DIAGNOSIS — G4761 Periodic limb movement disorder: Secondary | ICD-10-CM

## 2016-02-26 DIAGNOSIS — R0683 Snoring: Secondary | ICD-10-CM

## 2016-02-26 DIAGNOSIS — G4730 Sleep apnea, unspecified: Secondary | ICD-10-CM

## 2016-02-26 DIAGNOSIS — I10 Essential (primary) hypertension: Secondary | ICD-10-CM

## 2016-02-26 DIAGNOSIS — G4710 Hypersomnia, unspecified: Secondary | ICD-10-CM

## 2016-03-01 ENCOUNTER — Telehealth: Payer: Self-pay | Admitting: Neurology

## 2016-03-01 DIAGNOSIS — E669 Obesity, unspecified: Secondary | ICD-10-CM

## 2016-03-01 DIAGNOSIS — G4733 Obstructive sleep apnea (adult) (pediatric): Secondary | ICD-10-CM

## 2016-03-01 DIAGNOSIS — G4736 Sleep related hypoventilation in conditions classified elsewhere: Secondary | ICD-10-CM

## 2016-03-01 NOTE — Telephone Encounter (Signed)
Kaylee Carpenter, please inform our patient of needed CPAP return, IMPRESSION:  1. Obstructive Sleep Apnea(OSA), not central, associated with REM sleep and causing a REM AHI of 46.6 /hr. , and desaturation (SpO2) to a nadir of 71% with prolonged desaturation time of 74 minutes.   RECOMMENDATIONS:  1. Advise full-night, attended, CPAP titration study to optimize therapy. The patient's REM dependent AHI and hypoxemia degree was only evident after the SPLIT time point had passed.

## 2016-03-01 NOTE — Procedures (Signed)
PATIENT'S NAME:  Kaylee, Carpenter DOB:      1955-12-06      MR#:    [id number]     DATE OF RECORDING: 02/26/2016 REFERRING M.D.:  Kaylee Landsman, MD Study Performed:   Baseline Polysomnogram HISTORY: Hypertension, hyperthyroidism- Graves', edema, BMI 54.2 super- obesity, PVD, SOB and complex sleep apnea (untreated). 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 She was told after colonoscopy that her GI team noted apnea.    Kaylee Carpenter was diagnosed with Sleep Apnea at the [facility] approximately in 2012. She received a CPAP machine, had been using CPAP continuously until 2015  She urgently needed new supplies. She has not used the machine in over a year. She reports that CPAP made a big difference for her she felt better, more alert, and her sleep was less fragmented.  The patient endorsed the Epworth Sleepiness Scale at 11/24 points.  The patient's weight 337 pounds with a height of 66 (inches), resulting in a BMI of 54.2 kg/m2.The patient's neck circumference measured 17 inches.  CURRENT MEDICATIONS: Carvedilol, Furosemide and Potassium Chloride   PROCEDURE:  This is a multichannel digital polysomnogram utilizing the Somnostar 11.2 system.  Electrodes and sensors were applied and monitored per AASM Specifications.   EEG, EOG, Chin and Limb EMG, were sampled at 200 Hz.  ECG, Snore and Nasal Pressure, Thermal Airflow, Respiratory Effort, CPAP Flow and Pressure, Oximetry was sampled at 50 Hz. Digital video and audio were recorded.      BASELINE STUDY Lights Out was at 22:38 and Lights On at 05:27.  Total recording time (TRT) was 409.5 minutes, with a total sleep time (TST) of 392.5 minutes.   The patient's sleep latency was 11 minutes.  REM latency was 70.5 minutes.  The sleep efficiency was 95.8 %.     SLEEP ARCHITECTURE: WASO (Wake after sleep onset) was 6 minutes.  There were 6.5 minutes in Stage N1, 283 minutes Stage N2, 0 minutes Stage N3 and 103 minutes in Stage  REM.  The percentage of Stage N1 was 1.7%, Stage N2 was 72.1%, Stage N3 was 0% and Stage R (REM sleep) was 26.2%.   RESPIRATORY ANALYSIS:  There were 83 respiratory events:  12 obstructive apneas, 0 central apneas and 0 mixed apneas with 71 hypopneas, and 0 respiratory event related arousals (RERAs).     The total APNEA/HYPOPNEA INDEX (AHI) was 12.7/hour.  80 events occurred in REM sleep and 6 events in NREM.  The REM AHI was 46.6 /hour, versus a non-REM AHI of 0.6. The patient spent 224.5 minutes of total sleep time in the supine position and 168 minutes in non-supine.  The supine AHI was 7.2 versus a non-supine AHI of 20.0.  OXYGEN SATURATION & C02:  The Wake baseline 02 saturation was 93%, with the lowest being 71%. Time spent below 89% saturation equaled 73 minutes.   PERIODIC LIMB MOVEMENTS:  The patient had a total of 0 Periodic Limb Movements.  The arousals were noted as: 18 were spontaneous, 0 were associated with PLMs, and 83 were associated with respiratory events.   Audio and video analysis did not show any abnormal or unusual movements, behaviors, phonations or vocalizations.  The patient took no bathroom breaks.  Loud Snoring was noted in all positions, hypoxemia noted with drops in SpO2 in REM dependent apnea.  EKG was in keeping with normal sinus rhythm (NSR).     IMPRESSION:  1. Obstructive Sleep Apnea(OSA), not central, associated with REM sleep  and causing a REM AHI of 46.6 /hr. , and desaturation (SpO2) to a nadir of 71% with prolonged desaturation time of 74 minutes.   RECOMMENDATIONS:  1. Advise full-night, attended, CPAP titration study to optimize therapy. The patient's REM dependent AHI and hypoxemia degree was only evident after the SPLIT time point had passed.    2. Avoid sedative-hypnotics which may worsen sleep apnea, alcohol and tobacco (as applicable). 3. Advise to lose weight, diet and exercise if not contraindicated (BMI 50-55). 4. Further information  regarding OSA may be obtained from [employer] (www.sleepfoundation.org) or American Sleep Apnea Association (www.sleepapnea.org).   I certify that I have reviewed the entire raw data recording prior to the issuance of this report in accordance with the Standards of Accreditation of the [place] [employer] of [employer] Medicine (AASM)      Larey Seat, MD  03-01-2016  Diplomat, American Board of Psychiatry and Neurology  Diplomat, American Board of [place] Director, [place] [facility] [employer]

## 2016-03-06 ENCOUNTER — Telehealth: Payer: Self-pay | Admitting: Neurology

## 2016-03-06 DIAGNOSIS — G4733 Obstructive sleep apnea (adult) (pediatric): Secondary | ICD-10-CM

## 2016-03-06 NOTE — Telephone Encounter (Signed)
LM for patient to call back.

## 2016-03-06 NOTE — Telephone Encounter (Signed)
-----   Message from Larey Seat, MD sent at 03/01/2016 12:31 PM EST ----- IMPRESSION:  1. Obstructive Sleep Apnea(OSA), not central, associated with REM sleep and causing a REM AHI of 46.6 /hr. , and desaturation (SpO2) to a nadir of 71% with prolonged desaturation time of 74 minutes.   RECOMMENDATIONS:  1. Advise full-night, attended, CPAP titration study to optimize therapy. The patient's REM dependent AHI and hypoxemia degree was only evident after the SPLIT time point had passed.

## 2016-03-06 NOTE — Telephone Encounter (Signed)
Per other phone note, titration study denied, autopap preferred by insurance

## 2016-03-06 NOTE — Telephone Encounter (Signed)
[employer] denied CPAP suggest Autopap

## 2016-03-06 NOTE — Telephone Encounter (Signed)
Patient called back and I was able to give results. I advised her that we are waiting to see if we will do a titration study or order AutoPAP. Patient voiced understanding. I will call patient back as soon as I hear something.

## 2016-03-08 NOTE — Telephone Encounter (Signed)
Can I get order for Auto-PAP?

## 2016-03-09 NOTE — Addendum Note (Signed)
Addended by: Larey Seat on: 03/09/2016 09:37 AM   Modules accepted: Orders

## 2016-03-13 ENCOUNTER — Other Ambulatory Visit: Payer: Self-pay | Admitting: Obstetrics and Gynecology

## 2016-03-13 NOTE — Telephone Encounter (Signed)
I LM for patient that insurance did deny 2nd sleep study and Dr. Brett Fairy will order Auto-PAP (we had discussed this option in last conversation). I advised that I will send orders to [facility]. PCP will get a copy of the report. And I stated that I will send her a letter with this information and remind her to make f/u appt.

## 2016-03-14 ENCOUNTER — Telehealth: Payer: Self-pay | Admitting: Neurology

## 2016-03-14 NOTE — Telephone Encounter (Signed)
See other phone note, I called back and left her a vm

## 2016-03-14 NOTE — Telephone Encounter (Signed)
Patient called back but I missed her call. I called her back, LM for her to call back if any further questions.

## 2016-03-14 NOTE — Telephone Encounter (Signed)
Patient called our office to return nurse's call.  Please call

## 2016-03-20 NOTE — Patient Instructions (Addendum)
Your procedure is scheduled on:  Tomorrow, March 22, 2016  Enter through the [employer] [facility] at:  7:15 AM  Pick up the phone at the desk and dial [phone].  Call this number if you have problems the morning of surgery: [phone].  Remember: Do NOT eat food or drink after:  Midnight tonight  Take these medicines the morning of surgery with a SIP OF WATER:  Carvedilol, Potassium  Bring CPAP day of surgery  Stop ALL herbal medications at this time  Do NOT smoke the day of surgery.  Do NOT wear jewelry (body piercing), metal hair clips/bobby pins, make-up, or nail polish. Do NOT wear lotions, powders, or perfumes.  You may wear deodorant. Do NOT shave for 48 hours prior to surgery. Do NOT bring valuables to the hospital. Contacts, dentures, or bridgework may not be worn into surgery.  Have a responsible adult drive you home and stay with you for 24 hours after your procedure  Bring a copy of your healthcare power of attorney and living will documents.  **Effective Friday, Jan. 12, 2018, [place] will implement no hospital visitations from children age 8 and younger due to a steady increase in flu activity in our community and hospitals. **

## 2016-03-21 ENCOUNTER — Encounter (HOSPITAL_COMMUNITY)
Admission: RE | Admit: 2016-03-21 | Discharge: 2016-03-21 | Disposition: A | Discharge status: Home / Self Care | Payer: BLUE CROSS/BLUE SHIELD | Source: Ambulatory Visit | Attending: Obstetrics and Gynecology | Admitting: Obstetrics and Gynecology

## 2016-03-21 ENCOUNTER — Encounter (HOSPITAL_COMMUNITY): Payer: Self-pay

## 2016-03-21 DIAGNOSIS — Z90710 Acquired absence of both cervix and uterus: Secondary | ICD-10-CM | POA: Diagnosis not present

## 2016-03-21 DIAGNOSIS — Z8249 Family history of ischemic heart disease and other diseases of the circulatory system: Secondary | ICD-10-CM | POA: Diagnosis not present

## 2016-03-21 DIAGNOSIS — Z800 Family history of malignant neoplasm of digestive organs: Secondary | ICD-10-CM | POA: Diagnosis not present

## 2016-03-21 DIAGNOSIS — Z91040 Latex allergy status: Secondary | ICD-10-CM | POA: Diagnosis not present

## 2016-03-21 DIAGNOSIS — I739 Peripheral vascular disease, unspecified: Secondary | ICD-10-CM | POA: Diagnosis not present

## 2016-03-21 DIAGNOSIS — Z79899 Other long term (current) drug therapy: Secondary | ICD-10-CM | POA: Diagnosis not present

## 2016-03-21 DIAGNOSIS — I10 Essential (primary) hypertension: Secondary | ICD-10-CM | POA: Diagnosis not present

## 2016-03-21 DIAGNOSIS — N8501 Benign endometrial hyperplasia: Secondary | ICD-10-CM | POA: Diagnosis not present

## 2016-03-21 DIAGNOSIS — G4730 Sleep apnea, unspecified: Secondary | ICD-10-CM | POA: Diagnosis not present

## 2016-03-21 DIAGNOSIS — R938 Abnormal findings on diagnostic imaging of other specified body structures: Secondary | ICD-10-CM | POA: Diagnosis present

## 2016-03-21 DIAGNOSIS — E0500 Thyrotoxicosis with diffuse goiter without thyrotoxic crisis or storm: Secondary | ICD-10-CM | POA: Diagnosis not present

## 2016-03-21 DIAGNOSIS — Z9049 Acquired absence of other specified parts of digestive tract: Secondary | ICD-10-CM | POA: Diagnosis not present

## 2016-03-21 DIAGNOSIS — Z7982 Long term (current) use of aspirin: Secondary | ICD-10-CM | POA: Diagnosis not present

## 2016-03-21 LAB — BASIC METABOLIC PANEL
Anion gap: 8 (ref 5–15)
BUN: 14 mg/dL (ref 6–20)
CALCIUM: 9.4 mg/dL (ref 8.9–10.3)
CO2: 27 mmol/L (ref 22–32)
CREATININE: 0.91 mg/dL (ref 0.44–1.00)
Chloride: 103 mmol/L (ref 101–111)
GFR calc Af Amer: >60 mL/min (ref >60)
GFR calc non Af Amer: >60 mL/min (ref >60)
GLUCOSE: 105 mg/dL — AB (ref 65–99)
Potassium: 4.3 mmol/L (ref 3.5–5.1)
Sodium: 138 mmol/L (ref 135–145)

## 2016-03-21 LAB — CBC
HEMATOCRIT: 39.7 % (ref 36.0–46.0)
Hemoglobin: 13 g/dL (ref 12.0–15.0)
MCH: 27.3 pg (ref 26.0–34.0)
MCHC: 32.7 g/dL (ref 30.0–36.0)
MCV: 83.4 fL (ref 78.0–100.0)
Platelets: 300 10*3/uL (ref 150–400)
RBC: 4.76 MIL/uL (ref 3.87–5.11)
RDW: 14.7 % (ref 11.5–15.5)
WBC: 5.7 10*3/uL (ref 4.0–10.5)

## 2016-03-22 ENCOUNTER — Encounter (HOSPITAL_COMMUNITY)
Admission: RE | Disposition: A | Discharge status: Home / Self Care | Payer: Self-pay | Source: Ambulatory Visit | Attending: Obstetrics and Gynecology

## 2016-03-22 ENCOUNTER — Encounter (HOSPITAL_COMMUNITY): Payer: Self-pay | Admitting: Anesthesiology

## 2016-03-22 ENCOUNTER — Ambulatory Visit (HOSPITAL_COMMUNITY)
Admission: RE | Admit: 2016-03-22 | Discharge: 2016-03-22 | Disposition: A | Discharge status: Home / Self Care | Payer: BLUE CROSS/BLUE SHIELD | Source: Ambulatory Visit | Attending: Obstetrics and Gynecology | Admitting: Obstetrics and Gynecology

## 2016-03-22 ENCOUNTER — Ambulatory Visit (HOSPITAL_COMMUNITY): Payer: BLUE CROSS/BLUE SHIELD | Admitting: Anesthesiology

## 2016-03-22 DIAGNOSIS — Z9049 Acquired absence of other specified parts of digestive tract: Secondary | ICD-10-CM | POA: Insufficient documentation

## 2016-03-22 DIAGNOSIS — N8501 Benign endometrial hyperplasia: Secondary | ICD-10-CM | POA: Diagnosis not present

## 2016-03-22 DIAGNOSIS — G4730 Sleep apnea, unspecified: Secondary | ICD-10-CM | POA: Insufficient documentation

## 2016-03-22 DIAGNOSIS — Z79899 Other long term (current) drug therapy: Secondary | ICD-10-CM | POA: Insufficient documentation

## 2016-03-22 DIAGNOSIS — Z91040 Latex allergy status: Secondary | ICD-10-CM | POA: Insufficient documentation

## 2016-03-22 DIAGNOSIS — Z90710 Acquired absence of both cervix and uterus: Secondary | ICD-10-CM | POA: Insufficient documentation

## 2016-03-22 DIAGNOSIS — Z7982 Long term (current) use of aspirin: Secondary | ICD-10-CM | POA: Insufficient documentation

## 2016-03-22 DIAGNOSIS — I10 Essential (primary) hypertension: Secondary | ICD-10-CM | POA: Insufficient documentation

## 2016-03-22 DIAGNOSIS — Z8249 Family history of ischemic heart disease and other diseases of the circulatory system: Secondary | ICD-10-CM | POA: Insufficient documentation

## 2016-03-22 DIAGNOSIS — Z800 Family history of malignant neoplasm of digestive organs: Secondary | ICD-10-CM | POA: Insufficient documentation

## 2016-03-22 DIAGNOSIS — I739 Peripheral vascular disease, unspecified: Secondary | ICD-10-CM | POA: Insufficient documentation

## 2016-03-22 DIAGNOSIS — R938 Abnormal findings on diagnostic imaging of other specified body structures: Secondary | ICD-10-CM | POA: Insufficient documentation

## 2016-03-22 DIAGNOSIS — E0500 Thyrotoxicosis with diffuse goiter without thyrotoxic crisis or storm: Secondary | ICD-10-CM | POA: Insufficient documentation

## 2016-03-22 HISTORY — PX: DILATATION & CURETTAGE/HYSTEROSCOPY WITH MYOSURE: SHX6511

## 2016-03-22 SURGERY — DILATATION & CURETTAGE/HYSTEROSCOPY WITH MYOSURE
Anesthesia: General | Site: Vagina

## 2016-03-22 MED ORDER — FENTANYL CITRATE (PF) 100 MCG/2ML IJ SOLN
INTRAMUSCULAR | Status: DC | PRN
  Administered 2016-03-22 (×2): 50 ug via INTRAVENOUS

## 2016-03-22 MED ORDER — KETOROLAC TROMETHAMINE 30 MG/ML IJ SOLN
INTRAMUSCULAR | Status: AC
  Filled 2016-03-22: qty 1

## 2016-03-22 MED ORDER — SODIUM CHLORIDE 0.9 % IR SOLN
Status: DC | PRN
  Administered 2016-03-22: 3000 mL

## 2016-03-22 MED ORDER — FENTANYL CITRATE (PF) 100 MCG/2ML IJ SOLN: 25.0000 ug | INTRAMUSCULAR | Status: DC | PRN

## 2016-03-22 MED ORDER — LIDOCAINE HCL (CARDIAC) 20 MG/ML IV SOLN
INTRAVENOUS | Status: DC | PRN
  Administered 2016-03-22: 70 mg via INTRAVENOUS
  Administered 2016-03-22: 30 mg via INTRAVENOUS

## 2016-03-22 MED ORDER — FENTANYL CITRATE (PF) 100 MCG/2ML IJ SOLN
INTRAMUSCULAR | Status: AC
  Filled 2016-03-22: qty 2

## 2016-03-22 MED ORDER — PROPOFOL 10 MG/ML IV BOLUS
INTRAVENOUS | Status: AC
  Filled 2016-03-22: qty 20

## 2016-03-22 MED ORDER — IBUPROFEN 800 MG PO TABS: 800.0000 mg | ORAL_TABLET | Freq: Three times a day (TID) | ORAL | 0 refills | Status: DC | PRN

## 2016-03-22 MED ORDER — ONDANSETRON HCL 4 MG/2ML IJ SOLN
INTRAMUSCULAR | Status: AC
  Filled 2016-03-22: qty 2

## 2016-03-22 MED ORDER — DEXAMETHASONE SODIUM PHOSPHATE 4 MG/ML IJ SOLN
INTRAMUSCULAR | Status: AC
  Filled 2016-03-22: qty 1

## 2016-03-22 MED ORDER — LACTATED RINGERS IV SOLN
INTRAVENOUS | Status: DC
  Administered 2016-03-22: 08:00:00 via INTRAVENOUS

## 2016-03-22 MED ORDER — MIDAZOLAM HCL 2 MG/2ML IJ SOLN
INTRAMUSCULAR | Status: AC
  Filled 2016-03-22: qty 2

## 2016-03-22 MED ORDER — KETOROLAC TROMETHAMINE 30 MG/ML IJ SOLN
INTRAMUSCULAR | Status: DC | PRN
  Administered 2016-03-22: 30 mg via INTRAVENOUS
  Administered 2016-03-22: 30 mg via INTRAMUSCULAR

## 2016-03-22 MED ORDER — MIDAZOLAM HCL 2 MG/2ML IJ SOLN
INTRAMUSCULAR | Status: DC | PRN
  Administered 2016-03-22: 1 mg via INTRAVENOUS

## 2016-03-22 MED ORDER — ONDANSETRON HCL 4 MG/2ML IJ SOLN
INTRAMUSCULAR | Status: DC | PRN
  Administered 2016-03-22: 4 mg via INTRAVENOUS

## 2016-03-22 MED ORDER — PROPOFOL 10 MG/ML IV BOLUS
INTRAVENOUS | Status: DC | PRN
  Administered 2016-03-22: 200 mg via INTRAVENOUS

## 2016-03-22 MED ORDER — LIDOCAINE HCL (CARDIAC) 20 MG/ML IV SOLN
INTRAVENOUS | Status: AC
  Filled 2016-03-22: qty 5

## 2016-03-22 MED ORDER — DEXAMETHASONE SODIUM PHOSPHATE 10 MG/ML IJ SOLN
INTRAMUSCULAR | Status: DC | PRN
  Administered 2016-03-22: 4 mg via INTRAVENOUS

## 2016-03-22 MED ORDER — ONDANSETRON HCL 4 MG/2ML IJ SOLN: 4.0000 mg | Freq: Once | INTRAMUSCULAR | Status: DC | PRN

## 2016-03-22 SURGICAL SUPPLY — 19 items
CANISTER SUCT 3000ML (MISCELLANEOUS) ×2 IMPLANT
CATH ROBINSON RED A/P 16FR (CATHETERS) ×2 IMPLANT
CLOTH BEACON ORANGE TIMEOUT ST (SAFETY) ×2 IMPLANT
CONTAINER PREFILL 10% NBF 60ML (FORM) ×4 IMPLANT
DEVICE MYOSURE LITE (MISCELLANEOUS) ×2 IMPLANT
DEVICE MYOSURE REACH (MISCELLANEOUS) IMPLANT
ELECT REM PT RETURN 9FT ADLT (ELECTROSURGICAL) ×2
ELECTRODE REM PT RTRN 9FT ADLT (ELECTROSURGICAL) ×1 IMPLANT
FILTER ARTHROSCOPY CONVERTOR (FILTER) ×2 IMPLANT
GLOVE BIOGEL PI IND STRL 7.0 (GLOVE) ×2 IMPLANT
GLOVE BIOGEL PI INDICATOR 7.0 (GLOVE) ×2
GOWN STRL REUS W/TWL LRG LVL3 (GOWN DISPOSABLE) ×4 IMPLANT
PACK VAGINAL MINOR WOMEN LF (CUSTOM PROCEDURE TRAY) ×2 IMPLANT
PAD OB MATERNITY 4.3X12.25 (PERSONAL CARE ITEMS) ×2 IMPLANT
SEAL ROD LENS SCOPE MYOSURE (ABLATOR) ×2 IMPLANT
TOWEL OR 17X24 6PK STRL BLUE (TOWEL DISPOSABLE) ×4 IMPLANT
TUBING AQUILEX INFLOW (TUBING) ×2 IMPLANT
TUBING AQUILEX OUTFLOW (TUBING) ×2 IMPLANT
WATER STERILE IRR 1000ML POUR (IV SOLUTION) ×2 IMPLANT

## 2016-03-22 NOTE — OR Nursing (Signed)
Specimen timeout completed with surgeon and surgical staff. 

## 2016-03-22 NOTE — Transfer of Care (Signed)
Immediate Anesthesia Transfer of Care Note  Patient: Kaylee Carpenter  Procedure(s) Performed: Procedure(s): DILATATION & CURETTAGE/HYSTEROSCOPY WITH MYOSURE (N/A)  Patient Location: PACU  Anesthesia Type:General  Level of Consciousness: awake, oriented and sedated  Airway & Oxygen Therapy: Patient Spontanous Breathing and Patient connected to nasal cannula oxygen  Post-op Assessment: Report given to RN and Post -op Vital signs reviewed and stable  Post vital signs: Reviewed and stable  Last Vitals:  Vitals:   03/22/16 0728  BP: (!) 143/89  Pulse: 63  Resp: 16  Temp: 36.4 C    Last Pain:  Vitals:   03/22/16 0728  TempSrc: Oral      Patients Stated Pain Goal: 3 ([id number])  Complications: No apparent anesthesia complications

## 2016-03-22 NOTE — Anesthesia Procedure Notes (Signed)
Procedure Name: LMA Insertion Date/Time: 03/22/2016 8:57 AM Performed by: Tobin Chad Pre-anesthesia Checklist: Patient identified, Emergency Drugs available, Suction available and Patient being monitored Patient Re-evaluated:Patient Re-evaluated prior to inductionOxygen Delivery Method: Circle system utilized and Simple face mask Preoxygenation: Pre-oxygenation with 100% oxygen Intubation Type: IV induction Ventilation: Mask ventilation without difficulty LMA: LMA inserted LMA Size: 4.0 Number of attempts: 1 Placement Confirmation: positive ETCO2 and breath sounds checked- equal and bilateral Dental Injury: Teeth and Oropharynx as per pre-operative assessment

## 2016-03-22 NOTE — Discharge Instructions (Signed)
General Anesthesia, Adult, Care After These instructions provide you with information about caring for yourself after your procedure. Your health care provider may also give you more specific instructions. Your treatment has been planned according to current medical practices, but problems sometimes occur. Call your health care provider if you have any problems or questions after your procedure. What can I expect after the procedure? After the procedure, it is common to have:  Vomiting.  A sore throat.  Mental slowness. It is common to feel:  Nauseous.  Cold or shivery.  Sleepy.  Tired.  Sore or achy, even in parts of your body where you did not have surgery. Follow these instructions at home: For at least 24 hours after the procedure:   Do not:  Participate in activities where you could fall or become injured.  Drive.  Use heavy machinery.  Drink alcohol.  Take sleeping pills or medicines that cause drowsiness.  Make important decisions or sign legal documents.  Take care of children on your own.  Rest. Eating and drinking   If you vomit, drink water, juice, or soup when you can drink without vomiting.  Drink enough fluid to keep your urine clear or pale yellow.  Make sure you have little or no nausea before eating solid foods.  Follow the diet recommended by your health care provider. General instructions   Have a responsible adult stay with you until you are awake and alert.  Return to your normal activities as told by your health care provider. Ask your health care provider what activities are safe for you.  Take over-the-counter and prescription medicines only as told by your health care provider.  If you smoke, do not smoke without supervision.  Keep all follow-up visits as told by your health care provider. This is important. Contact a health care provider if:  You continue to have nausea or vomiting at home, and medicines are not helpful.  You  cannot drink fluids or start eating again.  You cannot urinate after 8-12 hours.  You develop a skin rash.  You have fever.  You have increasing redness at the site of your procedure. Get help right away if:  You have difficulty breathing.  You have chest pain.  You have unexpected bleeding.  You feel that you are having a life-threatening or urgent problem. This information is not intended to replace advice given to you by your health care provider. Make sure you discuss any questions you have with your health care provider. Document Released: 04/16/2000 Document Revised: 06/13/2015 Document Reviewed: 12/23/2014 Elsevier Interactive Patient Education  2017 [employer] Inc. Dilation and Curettage or Vacuum Curettage, Care After  These instructions give you information about caring for yourself after your procedure. Your doctor may also give you more specific instructions. Call your doctor if you have any problems or questions after your procedure.  Follow these instructions at home: Activity    Take short walks often, followed by rest periods. Ask your doctor what activities are safe for you. After one or two days, you may be able to return to your normal activities.  Do not lift anything that is heavier than 10 lb (4.5 kg) until your doctor approves.  For at least 2 weeks, or as long as told by your doctor:  Do not douche.  Do not use tampons.  Do not have sex. General instructions   Take over-the-counter and prescription medicines only as told by your doctor. This is very important if you take blood  thinning medicine.  Do not take baths, swim, or use a hot tub until your doctor approves. Take showers instead of baths.  Wear compression stockings as told by your doctor.  It is up to you to get the results of your procedure. Ask your doctor when your results will be ready.  Keep all follow-up visits as told by your doctor. This is important.  Summary  Take short  walks often, followed by rest periods. Ask your doctor what activities are safe for you. After one or two days, you may be able to return to your normal activities.  Do not lift anything that is heavier than 10 lb (4.5 kg) until your doctor approves.  Do not take baths, swim, or use a hot tub until your doctor approves. Take showers instead of baths.  Contact your doctor if you have any symptoms of infection, like bad-smelling discharge from your vagina. This information is not intended to replace advice given to you by your health care provider. Make sure you discuss any questions you have with your health care provider.      CALL  IF TEMP>100.4,   NOTHING PER VAGINA X 1 Wk,   CALL IF SOAKING A MAXI  PAD EVERY HOUR OR MORE FREQUENTLY

## 2016-03-22 NOTE — H&P (Signed)
Kaylee Carpenter is an 61 y.o. female [phone] BF with endometrial thickening on sonogram presents for dx hysteroscopy, D&C , removal of endometrial mass noted on sonohysterogram  Pertinent Gynecological History: Menses: post-menopausal Bleeding: none Contraception: none DES exposure: denies Blood transfusions: none Sexually transmitted diseases: no past history Previous GYN Procedures: DNC  Last mammogram: normal Date: 2018 Last pap: normal Date: 01/30/2016 OB History: G4, P2   Menstrual History: Menarche age: n/a No LMP recorded. Patient is postmenopausal.    Past Medical History:  Diagnosis Date  . Hypertension   . Hyperthyroidism 2007   Graves Disease  . PVD (peripheral vascular disease) ([place])   . Shortness of breath    due to edema and weight  . Sleep apnea    uses a CPAP    Past Surgical History:  Procedure Laterality Date  . CHOLECYSTECTOMY  1985  . COLONOSCOPY WITH PROPOFOL N/A 02/24/2016   Procedure: COLONOSCOPY WITH PROPOFOL;  Surgeon: Carol Ada, MD;  Location: [facility];  Service: Endoscopy;  Laterality: N/A;  . DILATATION & CURRETTAGE/HYSTEROSCOPY WITH RESECTOCOPE N/A 09/17/2013   Procedure: DILATATION & CURETTAGE, HYSTEROSCOPY, CYSTOSCOPY;  Surgeon: Delice Lesch, MD;  Location: [facility];  Service: Gynecology;  Laterality: N/A;  . DILATION AND CURETTAGE OF UTERUS      Family History  Problem Relation Age of Onset  . Hypertension Mother   . Colon cancer Father     Social History:  reports that she has never smoked. She has never used smokeless tobacco. She reports that she does not drink alcohol or use drugs.  Allergies:  Allergies  Allergen Reactions  . Latex Other (See Comments)    Top layer of skin came off    Prescriptions Prior to Admission  Medication Sig Dispense Refill Last Dose  . aspirin 81 MG tablet Take 81 mg by mouth daily as needed.   Past Week at Unknown time  . carvedilol (COREG) 12.5 MG tablet Take 12.5 mg by mouth 2 (two)  times daily with a meal.    03/21/2016 at Unknown time  . cholecalciferol (VITAMIN D) 1000 units tablet Take 1,000 Units by mouth daily.   03/21/2016 at Unknown time  . furosemide (LASIX) 40 MG tablet Take 40 mg by mouth daily.    03/21/2016 at Unknown time  . potassium chloride SA (K-DUR,KLOR-CON) 20 MEQ tablet Take 20 mEq by mouth daily.    03/21/2016 at Unknown time  . triamcinolone cream (KENALOG) 0.1 % Apply 1 application topically daily as needed. legs        Review of Systems  All other systems reviewed and are negative.   Blood pressure (!) 143/89, pulse 63, temperature 97.5 F (36.4 C), temperature source Oral, resp. rate 16, SpO2 96 %. Physical Exam  Constitutional: She is oriented to person, place, and time. She appears well-developed and well-nourished.  HENT:  Head: Atraumatic.  Eyes: EOM are normal.  Neck: Neck supple.  Cardiovascular: Regular rhythm.   Respiratory: Effort normal.  GI: Soft.  Genitourinary: Vagina normal.  Genitourinary Comments: Vulva nl. Vagina atrophic Cervix no lesion Uterus multiple fibroid Adnexa no palp mass  Musculoskeletal: Normal range of motion.  Neurological: She is alert and oriented to person, place, and time.  Skin: Skin is warm and dry.  Psychiatric: She has a normal mood and affect.   CBC    Component Value Date/Time   WBC 5.7 03/21/2016 1030   RBC 4.76 03/21/2016 1030   HGB 13.0 03/21/2016 1030   HCT 39.7  03/21/2016 1030   PLT 300 03/21/2016 1030   MCV 83.4 03/21/2016 1030   MCH 27.3 03/21/2016 1030   MCHC 32.7 03/21/2016 1030   RDW 14.7 03/21/2016 1030   LYMPHSABS 2.1 10/07/2009 2147   MONOABS 0.4 10/07/2009 2147   EOSABS 0.0 10/07/2009 2147   BASOSABS 0.0 10/07/2009 2147    Results for orders placed or performed during the hospital encounter of 03/21/16 (from the past 24 hour(s))  CBC     Status: None   Collection Time: 03/21/16 10:30 AM  Result Value Ref Range   WBC 5.7 4.0 - 10.5 K/uL   RBC 4.76 3.87 - 5.11 MIL/uL    Hemoglobin 13.0 12.0 - 15.0 g/dL   HCT 39.7 36.0 - 46.0 %   MCV 83.4 78.0 - 100.0 fL   MCH 27.3 26.0 - 34.0 pg   MCHC 32.7 30.0 - 36.0 g/dL   RDW 14.7 11.5 - 15.5 %   Platelets 300 150 - 400 K/uL  Basic metabolic panel     Status: Abnormal   Collection Time: 03/21/16 10:30 AM  Result Value Ref Range   Sodium 138 135 - 145 mmol/L   Potassium 4.3 3.5 - 5.1 mmol/L   Chloride 103 101 - 111 mmol/L   CO2 27 22 - 32 mmol/L   Glucose, Bld 105 (H) 65 - 99 mg/dL   BUN 14 6 - 20 mg/dL   Creatinine, Ser 0.91 0.44 - 1.00 mg/dL   Calcium 9.4 8.9 - 10.3 mg/dL   GFR calc non Af Amer >60 >60 mL/min   GFR calc Af Amer >60 >60 mL/min   Anion gap 8 5 - 15    No results found.  Assessment/Plan: Endometrial thickening on sonogram Endometrial mass P) dx hysteroscopy, D&C, removal of endometrial mass. Risk of surgery includes infection, bleeding, uterine perforation and its risk, thermal injury, fluid overload and its mgmt, injury to surrounding organ structures. All ? answered  Bodee Lafoe A 03/22/2016, 7:56 AM

## 2016-03-22 NOTE — Brief Op Note (Signed)
03/22/2016  9:41 AM  PATIENT:  Kaylee Carpenter  61 y.o. female  PRE-OPERATIVE DIAGNOSIS:  Endometrial Mass, Endometrial Thickening on Sonogram  POST-OPERATIVE DIAGNOSIS:  Endometrial Thickening on Sonogram  PROCEDURE:  Diagnostic hysteroscopy, hysteroscopic resection of endometrial thickening using myosure, dilation and curettage  SURGEON:  Surgeon(s) and Role:    * Servando Salina, MD - Primary  PHYSICIAN ASSISTANT:   ASSISTANTS: none   ANESTHESIA:   general FINDINGS; IRREG THICKENED ENDOMETRIUM, TUBAL OSTIA NOT SEEN  \EBL:  Total I/O In: 800 [I.V.:800] Out: 55 [Urine:50; Blood:5]  BLOOD ADMINISTERED:none  DRAINS: none   LOCAL MEDICATIONS USED:  NONE  SPECIMEN:  Source of Specimen:  emc  DISPOSITION OF SPECIMEN:  PATHOLOGY  COUNTS:  YES  TOURNIQUET:  * No tourniquets in log *  DICTATION: .Other Dictation: Dictation Number [id number]  PLAN OF CARE: Discharge to home after PACU  PATIENT DISPOSITION:  PACU - hemodynamically stable.   Delay start of Pharmacological VTE agent (>24hrs) due to surgical blood loss or risk of bleeding: not applicable

## 2016-03-22 NOTE — Anesthesia Preprocedure Evaluation (Signed)
Anesthesia Evaluation  Patient identified by MRN, date of birth, ID band Patient awake    Reviewed: Allergy & Precautions, H&P , Patient's Chart, lab work & pertinent test results, reviewed documented beta blocker date and time   Airway Mallampati: II  TM Distance: >3 FB Neck ROM: full    Dental no notable dental hx. (+) Chipped,    Pulmonary sleep apnea and Continuous Positive Airway Pressure Ventilation ,    Pulmonary exam normal breath sounds clear to auscultation       Cardiovascular hypertension, On Medications  Rhythm:regular Rate:Normal     Neuro/Psych    GI/Hepatic   Endo/Other  Morbid obesity  Renal/GU      Musculoskeletal   Abdominal   Peds  Hematology   Anesthesia Other Findings No difficulty with previous GA for Hysteroscopy w/ LMA #4  Reproductive/Obstetrics                             Anesthesia Physical  Anesthesia Plan  ASA: III  Anesthesia Plan:    Post-op Pain Management:    Induction: Intravenous  Airway Management Planned: LMA  Additional Equipment:   Intra-op Plan:   Post-operative Plan:   Informed Consent: I have reviewed the patients History and Physical, chart, labs and discussed the procedure including the risks, benefits and alternatives for the proposed anesthesia with the patient or authorized representative who has indicated his/her understanding and acceptance.   Dental Advisory Given and Dental advisory given  Plan Discussed with: CRNA and Surgeon  Anesthesia Plan Comments: (Discussed GA with LMA, possible sore throat, potential need to switch to ETT, N/V, pulmonary aspiration. Questions answered. )        Anesthesia Quick Evaluation

## 2016-03-22 NOTE — Anesthesia Postprocedure Evaluation (Signed)
Anesthesia Post Note  Patient: Kaylee Carpenter  Procedure(s) Performed: Procedure(s) (LRB): DILATATION & CURETTAGE/HYSTEROSCOPY WITH MYOSURE (N/A)  Patient location during evaluation: PACU Anesthesia Type: General Level of consciousness: awake Pain management: satisfactory to patient Vital Signs Assessment: post-procedure vital signs reviewed and stable Respiratory status: spontaneous breathing Cardiovascular status: stable Anesthetic complications: no        Last Vitals:  Vitals:   03/22/16 1030 03/22/16 1200  BP: (!) 145/87 134/81  Pulse: (!) 57 66  Resp: 15 14  Temp:  36.4 C    Last Pain:  Vitals:   03/22/16 0728  TempSrc: Oral   Pain Goal: Patients Stated Pain Goal: 3 (03/22/16 0728)               Lyndle Herrlich EDWARD

## 2016-03-23 ENCOUNTER — Encounter (HOSPITAL_COMMUNITY): Payer: Self-pay | Admitting: Obstetrics and Gynecology

## 2016-03-23 NOTE — Op Note (Signed)
Kaylee Carpenter, Kaylee Carpenter           ACCOUNT NO.:  [id number]  MEDICAL RECORD NO.:  [id number]  LOCATION:                                 FACILITY:  PHYSICIAN:  Servando Salina, M.D.DATE OF BIRTH:  1955-07-25  DATE OF PROCEDURE:  03/22/2016 DATE OF DISCHARGE:                              OPERATIVE REPORT   PREOPERATIVE DIAGNOSIS:  Endometrial thickening on sonogram, endometrial mass.  PROCEDURE:  Diagnostic hysteroscopy, hysteroscopic resection of endometrial thickening with using MyoSure, dilation and curettage.  POSTOPERATIVE DIAGNOSES:  Endometrial thickening on sonogram.  ANESTHESIA:  General.  SURGEON:  Servando Salina, M.D.  ASSISTANT:  None.  DESCRIPTION OF PROCEDURE:  Under adequate general anesthesia, the patient was placed in a dorsal lithotomy position.  She was sterilely prepped and draped in the usual fashion.  The bladder was catheterized, mild amount of urine.  Examination under anesthesia revealed an anteflexed irregular uterus.  No adnexal masses could be appreciated. The exam was limited by the patient's body habitus.  A bivalve speculum was placed in vagina.  Single-tooth tenaculum was placed on the anterior lip of the cervix.  The cervix was then serially dilated up to a #21 Pratt dilator.  A MyoSure hysteroscope was introduced into the uterine cavity.  Neither tubal ostia could be seen.  The endometrial cavity was thickened with midline what appeared almost like a septum, but actually I think endometrial tissue was noted.  Using the Lite MyoSure resectoscope, the entire endometrial cavity was resected.  When the resection was felt to be adequate, the endocervical canal was inspected. No lesions noted.  The resectoscope was removed.  The cavity was then curetted for scant amount of tissue.  All instruments were then removed from the vagina.  SPECIMEN:  Labeled endometrial curetting was sent to Pathology.  ESTIMATED BLOOD LOSS:   Minimal.  COMPLICATIONS:  None.  FLUID DEFICIT:  400 mL.  SPONGE AND INSTRUMENT COUNT:  Counts x2 was correct.  The patient tolerated the procedure well, was transferred to recovery in stable condition.     Servando Salina, M.D.   ______________________________ Servando Salina, M.D.    Kay/MEDQ  D:  03/22/2016  T:  03/22/2016  Job:  [id number]

## 2016-06-13 ENCOUNTER — Ambulatory Visit (INDEPENDENT_AMBULATORY_CARE_PROVIDER_SITE_OTHER): Payer: BLUE CROSS/BLUE SHIELD | Admitting: Neurology

## 2016-06-13 ENCOUNTER — Encounter: Payer: Self-pay | Admitting: Neurology

## 2016-06-13 ENCOUNTER — Encounter (INDEPENDENT_AMBULATORY_CARE_PROVIDER_SITE_OTHER): Payer: Self-pay

## 2016-06-13 VITALS — BP 140/88 | HR 60 | Ht 66.0 in | Wt 342.0 lb

## 2016-06-13 DIAGNOSIS — E669 Obesity, unspecified: Secondary | ICD-10-CM | POA: Diagnosis not present

## 2016-06-13 DIAGNOSIS — Z9989 Dependence on other enabling machines and devices: Secondary | ICD-10-CM | POA: Diagnosis not present

## 2016-06-13 DIAGNOSIS — G4736 Sleep related hypoventilation in conditions classified elsewhere: Secondary | ICD-10-CM

## 2016-06-13 DIAGNOSIS — G4733 Obstructive sleep apnea (adult) (pediatric): Secondary | ICD-10-CM

## 2016-06-13 NOTE — Progress Notes (Signed)
SLEEP MEDICINE CLINIC   Provider:  Larey Seat, M D  Referring Provider: Haywood Pao, MD Primary Care Physician:  Haywood Pao, MD    Interval history from 06/13/2016, had the pleasure of seeing [name] today underwent a polysomnography and auto- titration. The patient's sleep study from 02/26/2016 documented and mild sleep apnea with an AHI of 12.7 which exacerbated during REM sleep to 46.6 per hour of sleep she also had surprisingly a lesser AHI in nonsupine sleep compared to supine sleep. There were no periodic limb movements, there was no evidence of cardiac arrhythmia. The patient spent 73 minutes in desaturation total time with a nadir at 71% SPO2.  Based on this constellation of findings the patient was asked to return for CPAP. She has been using an auto titration. Between 5 and 20 cm water with 3 cm EPR she has been 100% compliance by days and 86% compliant by over 4 hours of nightly use. Average user time is 5 hours 26 minutes. The patient feels that the machine does not Really reflect her true sleep time and use. She had a CPAP machine for a long time and felt more comfortable with it settings before auto titrator use . A residual AHI is 0.8, the 95th percentile pressure is 10.1 and she does have minimal air leaks.   I would like she feels that there is not enough pressure produced. I will reduce her ramp time and maybe start the Ramp at 7 cm water, shortened it to a period of 5 minutes and set the pressure at 11 cm water with 3 cm EPR. She also has noted that the water reservoir seems to not be used. She can go for whole week without having to refill. I will ask my sleep lab manager to revisit the machine, mask fit and settings today.    HPI:  Kaylee Carpenter is a 60 y.o. female , seen here as a referral from Dr. Osborne Casco, to establish sleep medicine care. Kaylee Carpenter has a history of lower extremity edema, obesity, diagnosed apnea on CPAP,  history of pneumonia, goiter with hyperthyroidism and Graves' disease diagnosed in 2008, iron deficiency anemia, hyperlipidemia, peripheral vascular disease. She remembers that she was diagnosed with obstructive sleep apnea at the [facility] approximately 2012. She received a CPAP machine which we will try to download today. She has been using CPAP continuously until 2015 after being diagnosed and prescribed CPAP. I do not have access to her original sleep study today. Dr Osborne Casco called her previous PCP and could not obtain her records pertaining the sleep study either.  She urgently needs new supplies. She has not used the machine in over a year. She reports that CPAP made a big difference for her she felt better, more alert her sleep was less fragmented.  Sleep habits are as follows: She goes to bed between 10 PM and 3 AM in the morning in a wide variable timeframe, the patient works from home between  12:30 and 8:30 PM. She retreats to her bedroom early but watches TV in the bedroom. Her bedroom is cool, she sleeps alone. She prefers sleeping on her side on 3 pillows. The TV runs all night !  Her sleep time varies depending on what is on TV. She reports that if she falls asleep between 10 and 11 PM she will be awake by 3 or 4 AM. If she goes to bed later for example 2 AM she  can sleep until 7 or 8 AM. She only gets 5 hours of sleep on average. She wakes up spontaneously not by alarm. She is taking a diuretic and keeps hydrating herself well, and will have up to 4 or 5 bathroom breaks at night. These further fragmented sleep. When she wakes up in the morning she does not have a headache and she does not have a very dry mouth.   Sleep medical history and family sleep history: No childhood history of sleep walking or night terrors, no enuresis. No history of traumatic brain injury, neck injury, neck surgery.ENT procedures: She underwent tonsillectomy at age 39 -none since.Her father was  diagnosed with obstructive sleep apnea and loud snoring, has been using CPAP. Social history:  Remote history of shift working for about 2-3 years, but not recent. Works from home. Fluid sleep times. She may drink 2-3 glasses of iced tea a day, coffee rarely soda. She has never used tobacco in any form. He does not drink alcohol.   Review of Systems: Out of a complete 14 system review, the patient complains of only the following symptoms, and all other reviewed systems are negative. Dizziness when sleeping without 3 pillows, snoring, apneas witnessed.   How likely are you to doze in the following situations: 0 = not likely, 1 = slight chance, 2 = moderate chance, 3 = high chance  Sitting and Reading? 2 Watching Television? 2 Sitting inactive in a public place (theater or meeting)?1 As a passenger in a car for an hour without a break? 1 Lying down in the afternoon when circumstances permit?2 Sitting and talking to someone? 0 Sitting quietly after lunch without alcohol?2 In a car, while stopped for a few minutes in traffic? 0   Total = 10    , Fatigue severity score:  34 from 44 , depression score; 4/15    Social History   Social History  . Marital status: Divorced    Spouse name: N/A  . Number of children: 2  . Years of education: BS   Occupational History  . Not on file.   Social History Main Topics  . Smoking status: Never Smoker  . Smokeless tobacco: Never Used  . Alcohol use No  . Drug use: No  . Sexual activity: Not on file   Other Topics Concern  . Not on file   Social History Narrative   Drinks about 1-2 sodas a week.     Family History  Problem Relation Age of Onset  . Hypertension Mother   . Colon cancer Father     Past Medical History:  Diagnosis Date  . Hypertension   . Hyperthyroidism 2007   Graves Disease  . PVD (peripheral vascular disease) ([place])   . Shortness of breath    due to edema and weight  . Sleep apnea    uses a CPAP    Past  Surgical History:  Procedure Laterality Date  . CHOLECYSTECTOMY  1985  . COLONOSCOPY WITH PROPOFOL N/A 02/24/2016   Procedure: COLONOSCOPY WITH PROPOFOL;  Surgeon: Carol Ada, MD;  Location: [facility];  Service: Endoscopy;  Laterality: N/A;  . DILATATION & CURETTAGE/HYSTEROSCOPY WITH MYOSURE N/A 03/22/2016   Procedure: [place];  Surgeon: Servando Salina, MD;  Location: [facility];  Service: Gynecology;  Laterality: N/A;  . DILATATION & CURRETTAGE/HYSTEROSCOPY WITH RESECTOCOPE N/A 09/17/2013   Procedure: DILATATION & CURETTAGE, HYSTEROSCOPY, CYSTOSCOPY;  Surgeon: Delice Lesch, MD;  Location: [facility];  Service: Gynecology;  Laterality:  N/A;  . DILATION AND CURETTAGE OF UTERUS      Current Outpatient Prescriptions  Medication Sig Dispense Refill  . aspirin 81 MG tablet Take 81 mg by mouth daily as needed.    . carvedilol (COREG) 12.5 MG tablet Take 12.5 mg by mouth 2 (two) times daily with a meal.     . cholecalciferol (VITAMIN D) 1000 units tablet Take 1,000 Units by mouth daily.    . furosemide (LASIX) 40 MG tablet Take 40 mg by mouth daily.     Marland Kitchen ibuprofen (ADVIL,MOTRIN) 800 MG tablet Take 1 tablet (800 mg total) by mouth every 8 (eight) hours as needed. 30 tablet 0  . triamcinolone cream (KENALOG) 0.1 % Apply 1 application topically daily as needed. legs      No current facility-administered medications for this visit.     Allergies as of 06/13/2016 - Review Complete 03/22/2016  Allergen Reaction Noted  . Latex Other (See Comments) 01/07/2012    Vitals: BP 140/88   Pulse 60   Ht 5\' 6"  (1.676 m)   Wt (!) 342 lb (155.1 kg)   BMI 55.20 kg/m  Last Weight:  Wt Readings from Last 1 Encounters:  06/13/16 (!) 342 lb (155.1 kg)   WNU:UVOZ mass index is 55.2 kg/m.     Last Height:   Ht Readings from Last 1 Encounters:  06/13/16 5\' 6"  (1.676 m)    Physical exam:  General: The patient is awake, alert and appears not in acute distress. The  patient is well groomed. Head: Normocephalic, atraumatic. Neck is supple. Mallampati 3,  neck circumference:17. Nasal airflow  unrestricted . Retrognathia is not seen.  Cardiovascular:  Regular rate and rhythm, without  murmurs or carotid bruit, and without distended neck veins. Respiratory: Lungs are clear to auscultation. Skin:  Without evidence of edema, or rash Trunk: BMI is morbidly obese . The patient's posture is stooped.    Neurologic exam : The patient is awake and alert, oriented to place and time.   Memory subjective described as intact. Attention span & concentration ability appears normal. Speech is fluent,  without dysarthria, dysphonia or aphasia. Mood and affect are appropriate.  Cranial nerves: Pupils are equal and briskly reactive to light. Hearing to finger rub intact.  Facial sensation intact to fine touch. Facial motor strength is still symmetric and tongue and uvula move midline. No fasciculation. Shoulder shrug was symmetrical.  Motor exam:  Normal tone, muscle bulk and symmetric strength in all extremities.Sensory:  Fine touch, pinprick and vibration - normal. Chronic back pain , lower back.  Coordination: Finger-to-nose maneuver without evidence of ataxia, dysmetria or tremor. Gait and station: Patient walks without assistive device .Deep tendon reflexes: in the upper and lower extremities are symmetric and intact. Babinski maneuver response is downgoing.  I was able to review a download for the patient's CPAP machine her average AHI was 0.5 on 10 cm water pressure but she was only able to use the machine 4 days out of the last 90. This is because of lack of supplies. She urgently needs filter, tubing and mask. In addition I was able to review the laboratory results from [place] office, dated 34 of November 2017. Normal comprehensive metabolic panel, normal CBC and differential, elevated cholesterol 230 ng, and LDL to HDL ratio was 3.1. Ferritin was 110.2 ng, her GFR  was in normal range.  The patient was advised of the nature of the diagnosed sleep disorder , the treatment options and risks for general  a health and wellness arising from not treating the condition.  I spent more than 20 minutes of face to face time with the patient. Greater than 50% of time was spent in counseling and coordination of care. We have discussed the diagnosis and differential and I answered the patient's questions.   I referrd the patient to the sleep lab for an adjustment in humidifier settings, and mask fitting. Currently on a Dream wear mask, likes nasal prongs -" pillows " better.    Assessment:  After physical and neurologic examination, review of laboratory studies,  Personal review of imaging studies, reports of other /same  Imaging studies ,  Results of polysomnography/ neurophysiology testing and pre-existing records as far as provided in visit., my assessment is   1)  Mrs. Schlemmer was diagnosed with obstructive sleep apnea, with a strong hypoxemia association.  2) There were several sleep hygiene issues to discuss as well.  The patient  eliminated the TV from her bedroom - and she has slept well without a light.   3) PLMs, not RLS - improved    Plan:  Treatment plan and additional workup :  Shortened Ramp time , start at 7 cm .  CPAP restricted to  7- 12 cm water     Larey Seat MD  06/13/2016   CC: Dr Osborne Casco.

## 2016-08-03 NOTE — Anesthesia Postprocedure Evaluation (Signed)
Anesthesia Post Note  Patient: Kaylee Carpenter  Procedure(s) Performed: Procedure(s) (LRB): DILATATION & CURETTAGE/HYSTEROSCOPY WITH MYOSURE (N/A)     Anesthesia Post Evaluation  Last Vitals:  Vitals:   03/22/16 1030 03/22/16 1200  BP: (!) 145/87 134/81  Pulse: (!) 57 66  Resp: 15 14  Temp:  36.4 C    Last Pain:  Vitals:   03/23/16 1623  TempSrc:   PainSc: 0-No pain                 Yoshito Gaza EDWARD

## 2016-08-03 NOTE — Addendum Note (Signed)
Addendum  created 08/03/16 1528 by Lyndle Herrlich, MD   Sign clinical note

## 2016-09-13 NOTE — Addendum Note (Signed)
Addendum  created 09/13/16 1033 by Roberts Gaudy, MD   Sign clinical note

## 2016-12-15 ENCOUNTER — Encounter: Payer: Self-pay | Admitting: Adult Health

## 2016-12-17 ENCOUNTER — Encounter: Payer: Self-pay | Admitting: Adult Health

## 2016-12-17 ENCOUNTER — Ambulatory Visit (INDEPENDENT_AMBULATORY_CARE_PROVIDER_SITE_OTHER): Payer: BLUE CROSS/BLUE SHIELD | Admitting: Adult Health

## 2016-12-17 VITALS — BP 126/77 | HR 64 | Wt 346.6 lb

## 2016-12-17 DIAGNOSIS — G4733 Obstructive sleep apnea (adult) (pediatric): Secondary | ICD-10-CM | POA: Diagnosis not present

## 2016-12-17 DIAGNOSIS — Z9989 Dependence on other enabling machines and devices: Secondary | ICD-10-CM | POA: Diagnosis not present

## 2016-12-17 NOTE — Patient Instructions (Signed)
Your Plan:  Continue using CPAP nightly If your symptoms worsen or you develop new symptoms please let us know.   Thank you for coming to see us at [facility]. I hope we have been able to provide you high quality care today.  You may receive a patient satisfaction survey over the next few weeks. We would appreciate your feedback and comments so that we may continue to improve ourselves and the health of our patients.  

## 2016-12-17 NOTE — Progress Notes (Signed)
PATIENT: Kaylee Carpenter DOB: Jun 04, 1955  REASON FOR VISIT: follow up HISTORY FROM: patient  HISTORY OF PRESENT ILLNESS: Today 12/17/16 Kaylee Carpenter is a 61 year old female with a history of obstructive sleep apnea on CPAP.  She returns today for follow-up.  Her CPAP download indicates that she use her machine nightly for compliance of 100%.  She uses her machine greater than 4 hours each night.  On average she uses her machine 6 hours and 24 minutes.  Her minimum pressure is 7 cm of water and maximum pressure is 12 cm of water with EPR of 3.  Her residual AHI is 0.9.  She does not have a significant leak.  She reports overall she has seen the benefit of using the machine.  She states that she is sleeping better and feels more rested throughout the day.  She returns today for an evaluation.  HISTORY 06/13/16: , had the pleasure of seeing Kaylee Carpenter today underwent a polysomnography and auto- titration. The patient's sleep study from 02/26/2016 documented and mild sleep apnea with an AHI of 12.7 which exacerbated during REM sleep to 46.6 per hour of sleep she also had surprisingly a lesser AHI in nonsupine sleep compared to supine sleep. There were no periodic limb movements, there was no evidence of cardiac arrhythmia. The patient spent 73 minutes in desaturation total time with a nadir at 71% SPO2.  Based on this constellation of findings the patient was asked to return for CPAP. She has been using an auto titration. Between 5 and 20 cm water with 3 cm EPR she has been 100% compliance by days and 86% compliant by over 4 hours of nightly use. Average user time is 5 hours 26 minutes. The patient feels that the machine does not Really reflect her true sleep time and use. She had a CPAP machine for a long time and felt more comfortable with it settings before auto titrator use . A residual AHI is 0.8, the 95th percentile pressure is 10.1 and she does have minimal air leaks.   REVIEW  OF SYSTEMS: Out of a complete 14 system review of symptoms, the patient complains only of the following symptoms, and all other reviewed systems are negative.  Shortness of breath  ALLERGIES: Allergies  Allergen Reactions  . Latex Other (See Comments)    Top layer of skin came off    HOME MEDICATIONS: Outpatient Medications Prior to Visit  Medication Sig Dispense Refill  . aspirin 81 MG tablet Take 81 mg by mouth daily as needed.    . carvedilol (COREG) 12.5 MG tablet Take 12.5 mg by mouth 2 (two) times daily with a meal.     . cholecalciferol (VITAMIN D) 1000 units tablet Take 1,000 Units by mouth daily.    . furosemide (LASIX) 40 MG tablet Take 40 mg by mouth daily.     Marland Kitchen spironolactone (ALDACTONE) 25 MG tablet TK 1 T PO  D  3  . triamcinolone cream (KENALOG) 0.1 % Apply 1 application topically daily as needed. legs     . ibuprofen (ADVIL,MOTRIN) 800 MG tablet Take 1 tablet (800 mg total) by mouth every 8 (eight) hours as needed. (Patient not taking: Reported on 12/17/2016) 30 tablet 0   No facility-administered medications prior to visit.     PAST MEDICAL HISTORY: Past Medical History:  Diagnosis Date  . Hypertension   . Hyperthyroidism 2007   Graves Disease  . OSA on CPAP   . PVD (peripheral vascular  disease) ([place])   . Shortness of breath    due to edema and weight  . Sleep apnea    uses a CPAP    PAST SURGICAL HISTORY: Past Surgical History:  Procedure Laterality Date  . CHOLECYSTECTOMY  1985  . COLONOSCOPY WITH PROPOFOL N/A 02/24/2016   Procedure: COLONOSCOPY WITH PROPOFOL;  Surgeon: Carol Ada, MD;  Location: [facility];  Service: Endoscopy;  Laterality: N/A;  . DILATATION & CURETTAGE/HYSTEROSCOPY WITH MYOSURE N/A 03/22/2016   Procedure: [place];  Surgeon: Servando Salina, MD;  Location: [facility];  Service: Gynecology;  Laterality: N/A;  . DILATATION & CURRETTAGE/HYSTEROSCOPY WITH RESECTOCOPE N/A 09/17/2013   Procedure:  DILATATION & CURETTAGE, HYSTEROSCOPY, CYSTOSCOPY;  Surgeon: Delice Lesch, MD;  Location: [facility];  Service: Gynecology;  Laterality: N/A;  . DILATION AND CURETTAGE OF UTERUS      FAMILY HISTORY: Family History  Problem Relation Age of Onset  . Hypertension Mother   . Colon cancer Father     SOCIAL HISTORY: Social History   Socioeconomic History  . Marital status: Divorced    Spouse name: Not on file  . Number of children: 2  . Years of education: BS  . Highest education level: Not on file  Social Needs  . Financial resource strain: Not on file  . Food insecurity - worry: Not on file  . Food insecurity - inability: Not on file  . Transportation needs - medical: Not on file  . Transportation needs - non-medical: Not on file  Occupational History  . Not on file  Tobacco Use  . Smoking status: Never Smoker  . Smokeless tobacco: Never Used  Substance and Sexual Activity  . Alcohol use: No  . Drug use: No  . Sexual activity: Not on file  Other Topics Concern  . Not on file  Social History Narrative   Drinks about 1-2 sodas a week.       PHYSICAL EXAM  Vitals:   12/17/16 0912  BP: 126/77  Pulse: 64  Weight: (!) 346 lb 9.6 oz (157.2 kg)   Body mass index is 55.94 kg/m.  Generalized: Well developed, in no acute distress  Chest: Lungs clear to auscultation  Neurological examination  Mentation: Alert oriented to time, place, history taking. Follows all commands speech and language fluent Cranial nerve II-XII: Pupils were equal round reactive to light. Extraocular movements were full, visual field were full on confrontational test. Facial sensation and strength were normal. Uvula tongue midline. Head turning and shoulder shrug  were normal and symmetric. Motor: The motor testing reveals 5 over 5 strength of all 4 extremities. Good symmetric motor tone is noted throughout.  Sensory: Sensory testing is intact to soft touch on all 4 extremities. No evidence of  extinction is noted.  Coordination: Cerebellar testing reveals good finger-nose-finger and heel-to-shin bilaterally.  Gait and station: Gait is normal. Reflexes: Deep tendon reflexes are symmetric and normal bilaterally.   DIAGNOSTIC DATA (LABS, IMAGING, TESTING) - I reviewed patient records, labs, notes, testing and imaging myself where available.  Lab Results  Component Value Date   WBC 5.7 03/21/2016   HGB 13.0 03/21/2016   HCT 39.7 03/21/2016   MCV 83.4 03/21/2016   PLT 300 03/21/2016      Component Value Date/Time   NA 138 03/21/2016 1030   K 4.3 03/21/2016 1030   CL 103 03/21/2016 1030   CO2 27 03/21/2016 1030   GLUCOSE 105 (H) 03/21/2016 1030   BUN 14 03/21/2016  1030   CREATININE 0.91 03/21/2016 1030   CALCIUM 9.4 03/21/2016 1030   PROT 7.2 09/15/2013 0915   ALBUMIN 3.5 09/15/2013 0915   AST 15 09/15/2013 0915   ALT 14 09/15/2013 0915   ALKPHOS 93 09/15/2013 0915   BILITOT <0.2 (L) 09/15/2013 0915   GFRNONAA >60 03/21/2016 1030   GFRAA >60 03/21/2016 1030        ASSESSMENT AND PLAN 61 y.o. year old female  has a past medical history of Hypertension, Hyperthyroidism (2007), OSA on CPAP, PVD (peripheral vascular disease) ([place]), Shortness of breath, and Sleep apnea. here with:  1.  Obstructive sleep apnea on CPAP  The patient CPAP download shows excellent compliance and good treatment of her apnea.  She is encouraged to continue using the CPAP nightly.  She is advised that if her symptoms worsen or she develops new symptoms she should let us know.  She will follow-up in 1 year or sooner if needed.  I spent 15 minutes with the patient. 50% of this time was spent discussing her CPAP download.     Ward Givens, MSN, NP-C 12/17/2016, 9:23 AM [facility] [address], [place],  [id number] [phone]

## 2016-12-17 NOTE — Progress Notes (Signed)
I agree with the assessment and plan as directed by NP .The patient is known to me .   Geanine Vandekamp, MD  

## 2017-12-18 ENCOUNTER — Ambulatory Visit: Payer: BLUE CROSS/BLUE SHIELD | Admitting: Neurology

## 2017-12-18 ENCOUNTER — Telehealth: Payer: Self-pay | Admitting: Neurology

## 2017-12-18 NOTE — Telephone Encounter (Signed)
Pt no showed today.

## 2017-12-24 ENCOUNTER — Encounter: Payer: Self-pay | Admitting: Neurology

## 2020-04-20 ENCOUNTER — Other Ambulatory Visit: Payer: Self-pay | Admitting: *Deleted

## 2020-04-20 DIAGNOSIS — I878 Other specified disorders of veins: Secondary | ICD-10-CM

## 2020-04-28 ENCOUNTER — Encounter: Payer: Self-pay | Admitting: Vascular Surgery

## 2020-04-28 ENCOUNTER — Ambulatory Visit (HOSPITAL_COMMUNITY)
Admission: RE | Admit: 2020-04-28 | Discharge: 2020-04-28 | Disposition: A | Discharge status: Home / Self Care | Payer: BC Managed Care – PPO | Source: Ambulatory Visit | Attending: Vascular Surgery | Admitting: Vascular Surgery

## 2020-04-28 ENCOUNTER — Other Ambulatory Visit: Payer: Self-pay

## 2020-04-28 ENCOUNTER — Ambulatory Visit: Payer: BC Managed Care – PPO | Admitting: Vascular Surgery

## 2020-04-28 VITALS — BP 126/81 | HR 58 | Temp 97.5°F | Resp 16 | Ht 65.0 in | Wt 314.0 lb

## 2020-04-28 DIAGNOSIS — I872 Venous insufficiency (chronic) (peripheral): Secondary | ICD-10-CM | POA: Diagnosis not present

## 2020-04-28 DIAGNOSIS — I878 Other specified disorders of veins: Secondary | ICD-10-CM | POA: Insufficient documentation

## 2020-04-28 NOTE — Progress Notes (Signed)
REASON FOR CONSULT:    Chronic venous insufficiency.  The consult is requested by [facility].   ASSESSMENT & PLAN:   CHRONIC VENOUS INSUFFICIENCY: Patient has CEAP C4a venous disease.  She has some deep venous reflux but no significant superficial venous reflux.  She has no evidence of DVT.  We have discussed the importance of intermittent leg elevation the proper positioning for this.  In addition I encouraged her to wear knee-high compression stockings at work where she stands and sits.  We also discussed importance of exercise specifically walking and water aerobics.  I have encouraged her to avoid prolonged sitting and standing.  In addition we discussed the importance of maintaining a healthy weight as central obesity especially increases lower extremity venous pressure.  I have also encouraged her to keep her skin well lubricated in the winter especially to prevent cracks.  I will be happy to see her back anytime if her venous symptoms progress.   Deitra Mayo, MD Office: [phone]   HPI:   Kaylee Carpenter is a pleasant 65 y.o. female, who is referred with chronic venous insufficiency.  I have reviewed the records from the referring office.  The patient was seen on 03/29/2020.  This was for a routine gynecologic exam.  Patient has essential hypertension and also was noted to have venous stasis changes of both lower extremities.  For this reason she was referred for vascular consultation.  On my history this patient has some aching pain with prolonged sitting and standing that her symptoms are minimal.  She does describe some swelling when she is on her feet for a long period of time.  She has had no previous history of DVT.  She is had no previous venous procedures.  She does wear compression stockings some which do help her symptoms.  She also elevates her legs at night which helps with the swelling.  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  Her past medical history is otherwise significant for hypertension and hypercholesterolemia.  Past Medical History:  Diagnosis Date  . Hypertension   . Hyperthyroidism 2007   Graves Disease  . OSA on CPAP   . PVD (peripheral vascular disease) ([place])   . Shortness of breath    due to edema and weight  . Sleep apnea    uses a CPAP    Family History  Problem Relation Age of Onset  . Hypertension Mother   . Colon cancer Father     SOCIAL HISTORY: Social History   Socioeconomic History  . Marital status: Divorced    Spouse name: Not on file  . Number of children: 2  . Years of education: BS  . Highest education level: Not on file  Occupational History  . Not on file  Tobacco Use  . Smoking status: Never Smoker  . Smokeless tobacco: Never Used  Substance and Sexual Activity  . Alcohol use: No  . Drug use: No  . Sexual activity: Not on file  Other Topics Concern  . Not on file  Social History Narrative   Drinks about 1-2 sodas a week.    Social Determinants of Health   Financial Resource Strain: Not on file  Food Insecurity: Not on file  Transportation Needs: Not on file  Physical Activity: Not on file  Stress: Not on file  Social Connections: Not on file  Intimate Partner Violence: Not on file    Allergies  Allergen Reactions  . Latex Other (See Comments)    Top layer of skin came off    Current Outpatient Medications  Medication Sig Dispense Refill  . aspirin 81 MG tablet Take 81 mg by mouth daily as needed.    . carvedilol (COREG) 12.5 MG tablet Take 12.5 mg by mouth 2 (two) times daily with a meal.     . cholecalciferol (VITAMIN D) 1000 units tablet Take 1,000 Units by mouth daily.    . furosemide (LASIX) 40 MG tablet Take 40 mg by mouth daily.     Marland Kitchen spironolactone (ALDACTONE) 25 MG tablet TK 1 T PO  D  3  . ibuprofen (ADVIL,MOTRIN) 800 MG tablet Take 1 tablet (800 mg total) by mouth every 8 (eight) hours as needed. (Patient not taking: No sig  reported) 30 tablet 0  . triamcinolone cream (KENALOG) 0.1 % Apply 1 application topically daily as needed. legs  (Patient not taking: Reported on 04/28/2020)     No current facility-administered medications for this visit.    REVIEW OF SYSTEMS:  [X]  denotes positive finding, [ ]  denotes negative finding Cardiac  Comments:  Chest pain or chest pressure:    Shortness of breath upon exertion: x   Short of breath when lying flat:    Irregular heart rhythm:        Vascular    Pain in calf, thigh, or hip brought on by ambulation:    Pain in feet at night that wakes you up from your sleep:     Blood clot in your veins:    Leg swelling:  x       Pulmonary    Oxygen at home:    Productive cough:     Wheezing:         Neurologic    Sudden weakness in arms or legs:     Sudden numbness in arms or legs:     Sudden onset of difficulty speaking or slurred speech:    Temporary loss of vision in one eye:     Problems with dizziness:         Gastrointestinal    Blood in stool:     Vomited blood:         Genitourinary    Burning when urinating:     Blood in urine:        Psychiatric    Major depression:         Hematologic    Bleeding problems:    Problems with blood clotting too easily:        Skin    Rashes or ulcers:        Constitutional    Fever or chills:     PHYSICAL EXAM:   Vitals:   04/28/20 0938  BP: 126/81  Pulse: (!) 58  Resp: 16  Temp: (!) 97.5 F (36.4 C)  TempSrc: Temporal  SpO2: 98%  Weight: (!) 314 lb (142.4 kg)  Height: 5\' 5"  (1.651 m)    GENERAL: The patient is a well-nourished female, in no acute distress. The vital signs are documented above. CARDIAC: There is a regular rate and rhythm.  VASCULAR: I do not detect carotid bruits. She has a palpable dorsalis pedis pulse on the left.  Otherwise I cannot palpate pedal pulses.  She does have a biphasic anterior tibial and dorsalis pedis signal bilaterally. She has significant hyperpigmentation  bilaterally and swelling bilaterally as documented in the photographs below.      PULMONARY:  There is good air exchange bilaterally without wheezing or rales. ABDOMEN: Soft and non-tender with normal pitched bowel sounds.  MUSCULOSKELETAL: There are no major deformities or cyanosis. NEUROLOGIC: No focal weakness or paresthesias are detected. SKIN: There are no ulcers or rashes noted. PSYCHIATRIC: The patient has a normal affect.  DATA:    VENOUS DUPLEX: I have independently interpreted her venous duplex scan today.  On the right side, there is no evidence of DVT or superficial venous thrombosis.  There is no significant deep venous reflux.  There is no significant superficial venous reflux.  On the left side she has no evidence of DVT or superficial venous thrombosis.  She has deep venous reflux involving the common femoral vein.  She has no significant superficial venous reflux.

## 2021-01-02 ENCOUNTER — Ambulatory Visit: Payer: BC Managed Care – PPO | Admitting: Internal Medicine

## 2021-03-17 ENCOUNTER — Other Ambulatory Visit: Payer: Self-pay

## 2021-03-17 ENCOUNTER — Ambulatory Visit (INDEPENDENT_AMBULATORY_CARE_PROVIDER_SITE_OTHER): Payer: Medicare Other | Admitting: Nurse Practitioner

## 2021-03-17 ENCOUNTER — Encounter: Payer: Self-pay | Admitting: Nurse Practitioner

## 2021-03-17 VITALS — BP 150/90 | HR 55 | Temp 98.7°F | Ht 65.0 in | Wt 310.0 lb

## 2021-03-17 DIAGNOSIS — Z0100 Encounter for examination of eyes and vision without abnormal findings: Secondary | ICD-10-CM

## 2021-03-17 DIAGNOSIS — G4733 Obstructive sleep apnea (adult) (pediatric): Secondary | ICD-10-CM

## 2021-03-17 DIAGNOSIS — E0500 Thyrotoxicosis with diffuse goiter without thyrotoxic crisis or storm: Secondary | ICD-10-CM | POA: Diagnosis not present

## 2021-03-17 DIAGNOSIS — Z6841 Body Mass Index (BMI) 40.0 and over, adult: Secondary | ICD-10-CM

## 2021-03-17 DIAGNOSIS — I10 Essential (primary) hypertension: Secondary | ICD-10-CM | POA: Diagnosis not present

## 2021-03-17 DIAGNOSIS — E669 Obesity, unspecified: Secondary | ICD-10-CM | POA: Insufficient documentation

## 2021-03-17 DIAGNOSIS — N390 Urinary tract infection, site not specified: Secondary | ICD-10-CM | POA: Insufficient documentation

## 2021-03-17 DIAGNOSIS — A599 Trichomoniasis, unspecified: Secondary | ICD-10-CM | POA: Insufficient documentation

## 2021-03-17 DIAGNOSIS — L309 Dermatitis, unspecified: Secondary | ICD-10-CM

## 2021-03-17 DIAGNOSIS — R109 Unspecified abdominal pain: Secondary | ICD-10-CM | POA: Insufficient documentation

## 2021-03-17 DIAGNOSIS — R600 Localized edema: Secondary | ICD-10-CM | POA: Insufficient documentation

## 2021-03-17 MED ORDER — TRIAMCINOLONE ACETONIDE 0.1 % EX CREA: 1.0000 "application " | TOPICAL_CREAM | Freq: Two times a day (BID) | CUTANEOUS | 2 refills | Status: DC | PRN

## 2021-03-17 NOTE — Progress Notes (Signed)
Subjective:  Patient ID: Kaylee Carpenter, female    DOB: 17-Apr-1955  Age: 66 y.o. MRN: [id number]  CC:  Chief Complaint  Patient presents with   Establish Care      HPI  This patient arrives today for the above.  Her only concern today is that she does not have a CPAP machine and has known sleep apnea. She has been without a machine since 2016, and believes her last sleep study was 2013.   Per ROS, she is having some blurry vision and believes it is related to chronic visual changes. She would like referral to an eye doctor today.  She has a history of Graves' disease is not currently taking any thyroid medication right now, however tells me she has been on medication in the past.  She has hypertension and currently takes carvedilol twice daily, spironolactone, and furosemide.  She is tolerating medication well.  She denies chest pain and shortness of breath.  She states she was diagnosed with eczema and applies triamcinolone cream as needed.  She is out of this and like refill today.  Past Medical History:  Diagnosis Date   Hypertension    Hyperthyroidism 2007   Graves Disease   OSA on CPAP    PVD (peripheral vascular disease) ([facility])    Shortness of breath    due to edema and weight   Sleep apnea    uses a CPAP      Family History  Problem Relation Age of Onset   Hypertension Mother    Colon cancer Father     Social History   Social History Narrative   Drinks about 1-2 sodas a week.    Social History   Tobacco Use   Smoking status: Never   Smokeless tobacco: Never  Substance Use Topics   Alcohol use: No     Current Meds  Medication Sig   aspirin 81 MG tablet Take 81 mg by mouth daily as needed.   carvedilol (COREG) 12.5 MG tablet Take 12.5 mg by mouth 2 (two) times daily with a meal.    cholecalciferol (VITAMIN D) 1000 units tablet Take 1,000 Units by mouth daily.   furosemide (LASIX) 40 MG tablet Take 40 mg by mouth daily.     spironolactone (ALDACTONE) 25 MG tablet TK 1 T PO  D    ROS:  Review of Systems  Eyes:  Negative for blurred vision.  Respiratory:  Negative for shortness of breath.   Cardiovascular:  Negative for chest pain.  Neurological:  Negative for loss of consciousness and headaches.    Objective:   Today's Vitals: BP (!) 150/90 (BP Location: Left Arm, Patient Position: Sitting, Cuff Size: Large)    Pulse (!) 55    Temp 98.7 F (37.1 C) (Oral)    Ht 5\' 5"  (1.651 m)    Wt (!) 310 lb (140.6 kg)    SpO2 91%    BMI 51.59 kg/m  Vitals with BMI 03/17/2021 04/28/2020 12/17/2016  Height 5\' 5"  5\' 5"  -  Weight 310 lbs 314 lbs 346 lbs 10 oz  BMI 40.98 11.91 -  Systolic 478 295 621  Diastolic 90 81 77  Pulse 55 58 64     Physical Exam Vitals reviewed.  Constitutional:      General: She is not in acute distress.    Appearance: Normal appearance.  HENT:     Head: Normocephalic and atraumatic.  Neck:     Vascular: No carotid bruit.  Cardiovascular:     Rate and Rhythm: Normal rate and regular rhythm.     Pulses: Normal pulses.     Heart sounds: Normal heart sounds.  Pulmonary:     Effort: Pulmonary effort is normal.     Breath sounds: Normal breath sounds.  Skin:    General: Skin is warm and dry.  Neurological:     General: No focal deficit present.     Mental Status: She is alert and oriented to person, place, and time.  Psychiatric:        Mood and Affect: Mood normal.        Behavior: Behavior normal.        Judgment: Judgment normal.         Assessment and Plan   1. Hypertension, unspecified type   2. Graves' disease   3. Class 3 severe obesity with body mass index (BMI) of 50.0 to 59.9 in adult, unspecified obesity type, unspecified whether serious comorbidity present ([place])   4. Routine eye exam   5. Eczema, unspecified type   6. OSA (obstructive sleep apnea)      Plan: 1.-6.  We will check blood work for further evaluation today further recommendations will be made  based upon these results.  Blood pressure uncontrolled and above goal, she will follow-up in 2 weeks for close monitoring of her blood pressure.  At that point we will also discuss lab work results.  We did discuss red flag signs of ACS and stroke and if these were to occur that she needs to call 911, she reports her understanding.  Referral to neurology made today for assistance with managing her obstructive sleep apnea, referral to ophthalmology also made today for eye exam.  Triamcinolone cream refilled today.   Tests ordered Orders Placed This Encounter  Procedures   TSH   Lipid panel   Comprehensive metabolic panel   CBC with Differential/Platelet   T3, free   T4, free   Ambulatory referral to Ophthalmology   Ambulatory referral to Neurology      Meds ordered this encounter  Medications   triamcinolone cream (KENALOG) 0.1 %    Sig: Apply 1 application topically 2 (two) times daily as needed. legs    Dispense:  30 g    Refill:  2    Order Specific Question:   Supervising Provider    Answer:   Binnie Rail [id number]    Patient to follow-up in 2 weeks or sooner as needed.  Ailene Ards, NP

## 2021-03-20 ENCOUNTER — Other Ambulatory Visit (INDEPENDENT_AMBULATORY_CARE_PROVIDER_SITE_OTHER): Payer: BC Managed Care – PPO

## 2021-03-20 ENCOUNTER — Other Ambulatory Visit: Payer: Self-pay

## 2021-03-20 DIAGNOSIS — Z0100 Encounter for examination of eyes and vision without abnormal findings: Secondary | ICD-10-CM | POA: Diagnosis not present

## 2021-03-20 DIAGNOSIS — Z6841 Body Mass Index (BMI) 40.0 and over, adult: Secondary | ICD-10-CM

## 2021-03-20 DIAGNOSIS — E0500 Thyrotoxicosis with diffuse goiter without thyrotoxic crisis or storm: Secondary | ICD-10-CM | POA: Diagnosis not present

## 2021-03-20 LAB — COMPREHENSIVE METABOLIC PANEL
ALT: 13 U/L (ref 0–35)
AST: 16 U/L (ref 0–37)
Albumin: 4 g/dL (ref 3.5–5.2)
Alkaline Phosphatase: 80 U/L (ref 39–117)
BUN: 12 mg/dL (ref 6–23)
CO2: 30 mEq/L (ref 19–32)
Calcium: 9.9 mg/dL (ref 8.4–10.5)
Chloride: 103 mEq/L (ref 96–112)
Creatinine, Ser: 0.89 mg/dL (ref 0.40–1.20)
GFR: 67.03 mL/min (ref >60.00)
Glucose, Bld: 100 mg/dL — ABNORMAL HIGH (ref 70–99)
Potassium: 3.5 mEq/L (ref 3.5–5.1)
Sodium: 139 mEq/L (ref 135–145)
Total Bilirubin: 0.4 mg/dL (ref 0.2–1.2)
Total Protein: 7.7 g/dL (ref 6.0–8.3)

## 2021-03-20 LAB — LIPID PANEL
Cholesterol: 220 mg/dL — ABNORMAL HIGH (ref 0–200)
HDL: 69.6 mg/dL (ref >39.00)
LDL Cholesterol: 131 mg/dL — ABNORMAL HIGH (ref 0–99)
NonHDL: 150.7
Total CHOL/HDL Ratio: 3
Triglycerides: 97 mg/dL (ref 0.0–149.0)
VLDL: 19.4 mg/dL (ref 0.0–40.0)

## 2021-03-20 LAB — CBC WITH DIFFERENTIAL/PLATELET
Basophils Absolute: 0 10*3/uL (ref 0.0–0.1)
Basophils Relative: 0.8 % (ref 0.0–3.0)
Eosinophils Absolute: 0.1 10*3/uL (ref 0.0–0.7)
Eosinophils Relative: 2.1 % (ref 0.0–5.0)
HCT: 38.5 % (ref 36.0–46.0)
Hemoglobin: 12.6 g/dL (ref 12.0–15.0)
Lymphocytes Relative: 43.9 % (ref 12.0–46.0)
Lymphs Abs: 2 10*3/uL (ref 0.7–4.0)
MCHC: 32.6 g/dL (ref 30.0–36.0)
MCV: 83.3 fl (ref 78.0–100.0)
Monocytes Absolute: 0.5 10*3/uL (ref 0.1–1.0)
Monocytes Relative: 11.8 % (ref 3.0–12.0)
Neutro Abs: 1.9 10*3/uL (ref 1.4–7.7)
Neutrophils Relative %: 41.4 % — ABNORMAL LOW (ref 43.0–77.0)
Platelets: 256 10*3/uL (ref 150.0–400.0)
RBC: 4.62 Mil/uL (ref 3.87–5.11)
RDW: 14.3 % (ref 11.5–15.5)
WBC: 4.6 10*3/uL (ref 4.0–10.5)

## 2021-03-21 LAB — T3, FREE: T3, Free: 3 pg/mL (ref 2.3–4.2)

## 2021-03-21 LAB — TSH: TSH: 1.4 u[IU]/mL (ref 0.35–5.50)

## 2021-03-21 LAB — T4, FREE: Free T4: 0.84 ng/dL (ref 0.60–1.60)

## 2021-04-07 ENCOUNTER — Other Ambulatory Visit: Payer: Self-pay

## 2021-04-07 ENCOUNTER — Ambulatory Visit (INDEPENDENT_AMBULATORY_CARE_PROVIDER_SITE_OTHER): Payer: BC Managed Care – PPO | Admitting: Nurse Practitioner

## 2021-04-07 VITALS — BP 142/98 | HR 68 | Temp 97.7°F | Ht 65.0 in | Wt 309.1 lb

## 2021-04-07 DIAGNOSIS — E785 Hyperlipidemia, unspecified: Secondary | ICD-10-CM | POA: Diagnosis not present

## 2021-04-07 DIAGNOSIS — I10 Essential (primary) hypertension: Secondary | ICD-10-CM

## 2021-04-07 DIAGNOSIS — R7309 Other abnormal glucose: Secondary | ICD-10-CM

## 2021-04-07 DIAGNOSIS — Z23 Encounter for immunization: Secondary | ICD-10-CM | POA: Diagnosis not present

## 2021-04-07 MED ORDER — CARVEDILOL 12.5 MG PO TABS: 12.5000 mg | ORAL_TABLET | Freq: Two times a day (BID) | ORAL | 1 refills | Status: DC

## 2021-04-07 MED ORDER — SPIRONOLACTONE 25 MG PO TABS: 25.0000 mg | ORAL_TABLET | Freq: Every day | ORAL | 1 refills | Status: DC

## 2021-04-07 MED ORDER — ATORVASTATIN CALCIUM 10 MG PO TABS: 10.0000 mg | ORAL_TABLET | Freq: Every day | ORAL | 0 refills | Status: DC

## 2021-04-07 MED ORDER — FUROSEMIDE 40 MG PO TABS: 40.0000 mg | ORAL_TABLET | Freq: Every day | ORAL | 1 refills | Status: DC

## 2021-04-07 NOTE — Assessment & Plan Note (Signed)
ASCVD risk score approximately 25%.  We did discuss the score and what this means and that I would recommend she start medication to reduce her cholesterol numbers to then reduce risk of heart attack or stroke.  She is agreeable to this and would like to try medication.  We will start her on low-dose of atorvastatin (10 mg by mouth every evening).  She will follow-up in 6 weeks to recheck lipid panel as well as metabolic panel and to see how she is tolerating the atorvastatin. ?

## 2021-04-07 NOTE — Assessment & Plan Note (Signed)
Chronic, blood pressure above goal today.  However she is out of her medications.  Refills for Coreg, frusemide, and spironolactone ordered today.  Patient's metabolic panel looks good.  She does have access to an automated blood pressure cuff at home and I have told her to check her blood pressure daily and keep a log of this.  She was told if she sees 2 or more blood pressures with systolic reading of 660 or higher or diastolic reading of 90 or higher to call the office for her next office visit.  We may need to adjust blood pressure medications based on these readings.  She reports her understanding.  We again discussed signs and symptoms of ACS and stroke and if these were to occur to call 911. ?

## 2021-04-07 NOTE — Assessment & Plan Note (Signed)
Glucose slightly elevated on last metabolic panel.  May consider checking A1c at next office visit. ?

## 2021-04-07 NOTE — Patient Instructions (Addendum)
Check your blood pressure at home, if your blood pressure is greater than 150 on the top or greater than 90 on the bottom for 2 or more readings, please call our office.  ? ?Atorvastatin Tablets ?What is this medication? ?ATORVASTATIN (a TORE va sta tin) treats high cholesterol and reduces the risk of heart attack and stroke. It works by decreasing bad cholesterol and fats (such as LDL, triglycerides) and increasing good cholesterol (HDL) in your blood. It belongs to a group of medications called statins. Changes to diet and exercise are often combined with this medication. ?This medicine may be used for other purposes; ask your health care provider or pharmacist if you have questions. ?COMMON BRAND NAME(S): Lipitor ?What should I tell my care team before I take this medication? ?They need to know if you have any of these conditions: ?Diabetes ?Frequently drink alcohol ?Kidney disease ?Liver disease ?Muscle cramps, pain ?Stroke ?Thyroid disease ?An unusual or allergic reaction to atorvastatin, other medications, foods, dyes, or preservatives ?Pregnant or trying to get pregnant ?Breast-feeding ?How should I use this medication? ?Take this medication by mouth. Take it as directed on the label at the same time every day. You can take it with or without food. If it upsets your stomach, take it with food. Keep taking it unless your care team tells you to stop. ?Do not take this medication with grapefruit juice. ?Talk to your care team about the use of this medication in children. While it may be prescribed for children as young as 10 for selected conditions, precautions do apply. ?Overdosage: If you think you have taken too much of this medicine contact a poison control center or emergency room at once. ?NOTE: This medicine is only for you. Do not share this medicine with others. ?What if I miss a dose? ?If you miss a dose, take it as soon as you can. If it is almost time for your next dose, take only that dose. Do not  take double or extra doses. ?What may interact with this medication? ?Do not take this medication with any of the following: ?Dasabuvir; ombitasvir; paritaprevir; ritonavir ?Ombitasvir; paritaprevir; ritonavir ?Posaconazole ?Red yeast rice ?This medication may also interact with the following: ?Alcohol ?Certain antibiotics like erythromycin and clarithromycin ?Certain antivirals for HIV or hepatitis ?Certain medications for cholesterol like fenofibrate, gemfibrozil, and niacin ?Certain medications for fungal infections like ketoconazole and itraconazole ?Colchicine ?Cyclosporine ?Digoxin ?Estrogen or progestin hormones ?Grapefruit juice ?Rifampin ?This list may not describe all possible interactions. Give your health care provider a list of all the medicines, herbs, non-prescription drugs, or dietary supplements you use. Also tell them if you smoke, drink alcohol, or use illegal drugs. Some items may interact with your medicine. ?What should I watch for while using this medication? ?Visit your care team for regular checks on your progress. Tell your care team if your symptoms do not start to get better or if they get worse. ?Your care team may tell you to stop taking this medication if you develop muscle problems. If your muscle problems do not go away after stopping this medication, contact your care team. ?Talk to your care team if you wish to become pregnant or think you might be pregnant. This medication can cause serious birth defects. ?Talk to your care team before breastfeeding. Changes to your treatment plan may be needed. ?This medication may increase blood sugar. Ask your care team if changes in diet or medications are needed if you have diabetes. ?If you are going  to need surgery or other procedure, tell your care team that you are using this medication. ?Taking this medication is only part of a total heart healthy program. Ask your care team if there are other changes you can make to improve your overall  health. ?What side effects may I notice from receiving this medication? ?Side effects that you should report to your care team as soon as possible: ?Allergic reactions--skin rash, itching, hives, swelling of the face, lips, tongue, or throat ?High blood sugar (hyperglycemia)--increased thirst or amount of urine, unusual weakness, fatigue, blurry vision ?Liver injury--right upper belly pain, loss of appetite, nausea, light-colored stool, dark yellow or brown urine, yellowing skin or eyes, unusual weakness, fatigue ?Muscle injury--unusual weakness, fatigue, muscle pain, dark yellow or brown urine, decrease in amount of urine ?Redness, blistering, peeling, or loosening of the skin, including inside the mouth ?Side effects that usually do not require medical attention (report to your care team if they continue or are bothersome): ?Diarrhea ?Nausea ?Trouble sleeping ?Upset stomach ?This list may not describe all possible side effects. Call your doctor for medical advice about side effects. You may report side effects to FDA at 1-800-FDA-1088. ?Where should I keep my medication? ?Keep out of the reach of children and pets. ?Store at room temperature between 20 and 25 degrees C (68 and 77 degrees F). Get rid of any unused medication after the expiration date. ?To get rid of medications that are no longer needed or have expired: ?Take the medication to a medication take-back program. Check with your pharmacy or law enforcement to find a location. ?If you cannot return the medication, check the label or package insert to see if the medication should be thrown out in the garbage or flushed down the toilet. If you are not sure, ask your care team. If it is safe to put it in the trash, take the medication out of the container. Mix the medication with cat litter, dirt, coffee grounds, or other unwanted substance. Seal the mixture in a bag or container. Put it in the trash. ?NOTE: This sheet is a summary. It may not cover all  possible information. If you have questions about this medicine, talk to your doctor, pharmacist, or health care provider. ?? 2022 Elsevier/Gold Standard (2020-07-11 00:00:00) ? ?

## 2021-04-07 NOTE — Assessment & Plan Note (Signed)
She requested flu shot be administered today as this was given. ?

## 2021-04-07 NOTE — Progress Notes (Signed)
? ? ? ?Subjective:  ?Patient ID: Kaylee Carpenter, female    DOB: 13-Dec-1955  Age: 66 y.o. MRN: [id number] ? ?CC:  ?Chief Complaint  ?Patient presents with  ? Follow-up  ?  No concerns  ?  ? ? ?HPI  ?This patient arrives today for the above. ? ?At last office that she establish care.  At that time blood work was collected and it was noted that her blood pressure was elevated.  She is here today for follow-up of her blood work as well as to check in on her blood pressure. ? ?She reports since her last office visit she ran out of her carvedilol, and has not taken any today.  She is also requesting refill on her spironolactone and furosemide.  She has not been checking blood pressure at home. ? ?Blood work at last office visit showed mildly elevated glucose, and hyperlipidemia.  Current ASCVD risk score is about 25%. ? ? ?Past Medical History:  ?Diagnosis Date  ? Hypertension   ? Hyperthyroidism 2007  ? Graves Disease  ? OSA on CPAP   ? PVD (peripheral vascular disease) ([place])   ? Shortness of breath   ? due to edema and weight  ? Sleep apnea   ? uses a CPAP  ? ? ? ? ?Family History  ?Problem Relation Age of Onset  ? Hypertension Mother   ? Colon cancer Father   ? ? ?Social History  ? ?Social History Narrative  ? Drinks about 1-2 sodas a week.   ? ?Social History  ? ?Tobacco Use  ? Smoking status: Never  ? Smokeless tobacco: Never  ?Substance Use Topics  ? Alcohol use: No  ? ? ? ?Current Meds  ?Medication Sig  ? aspirin 81 MG tablet Take 81 mg by mouth daily as needed.  ? atorvastatin (LIPITOR) 10 MG tablet Take 1 tablet (10 mg total) by mouth daily.  ? cholecalciferol (VITAMIN D) 1000 units tablet Take 1,000 Units by mouth daily.  ? triamcinolone cream (KENALOG) 0.1 % Apply 1 application topically 2 (two) times daily as needed. legs  ? [DISCONTINUED] carvedilol (COREG) 12.5 MG tablet Take 12.5 mg by mouth 2 (two) times daily with a meal.   ? [DISCONTINUED] furosemide (LASIX) 40 MG tablet Take 40 mg by mouth daily.    ? [DISCONTINUED] spironolactone (ALDACTONE) 25 MG tablet TK 1 T PO  D  ? ? ?ROS:  ?Review of Systems  ?Constitutional:  Negative for fever.  ?Eyes:  Negative for blurred vision.  ?Respiratory:  Negative for shortness of breath.   ?Cardiovascular:  Negative for chest pain.  ?Neurological:  Negative for headaches.  ? ? ?Objective:  ? ?Today's Vitals: BP (!) 142/98   Pulse 68   Temp 97.7 ?F (36.5 ?C) (Oral)   Ht '5\' 5"'$  (1.651 m)   Wt (!) 309 lb 2 oz (140.2 kg)   SpO2 95%   BMI 51.44 kg/m?  ?Vitals with BMI 04/07/2021 04/07/2021 03/17/2021  ?Height - '5\' 5"'$  '5\' 5"'$   ?Weight - 309 lbs 2 oz 310 lbs  ?BMI - 51.44 51.59  ?Systolic [phone]  ?Diastolic 98 100 90  ?Pulse - 68 55  ?  ? ?Physical Exam ?Vitals reviewed.  ?Constitutional:   ?   General: She is not in acute distress. ?   Appearance: Normal appearance.  ?HENT:  ?   Head: Normocephalic and atraumatic.  ?Neck:  ?   Vascular: No carotid bruit.  ?Cardiovascular:  ?  Rate and Rhythm: Normal rate and regular rhythm.  ?   Pulses: Normal pulses.  ?   Heart sounds: Normal heart sounds.  ?Pulmonary:  ?   Effort: Pulmonary effort is normal.  ?   Breath sounds: Normal breath sounds.  ?Skin: ?   General: Skin is warm and dry.  ?Neurological:  ?   General: No focal deficit present.  ?   Mental Status: She is alert and oriented to person, place, and time.  ?Psychiatric:     ?   Mood and Affect: Mood normal.     ?   Behavior: Behavior normal.     ?   Judgment: Judgment normal.  ? ? ? ? ? ? ? ?Assessment and Plan  ? ?1. Hypertension, unspecified type   ?2. Need for vaccination   ?3. Hyperlipidemia, unspecified hyperlipidemia type   ?4. Elevated glucose   ? ? ? ?Plan: ?See plan via problem list below. ? ? ? ?Tests ordered ?Orders Placed This Encounter  ?Procedures  ? Flu Vaccine QUAD High Dose(Fluad)  ? ? ? ? ?Meds ordered this encounter  ?Medications  ? furosemide (LASIX) 40 MG tablet  ?  Sig: Take 1 tablet (40 mg total) by mouth daily.  ?  Dispense:  90 tablet  ?  Refill:  1   ? spironolactone (ALDACTONE) 25 MG tablet  ?  Sig: Take 1 tablet (25 mg total) by mouth daily.  ?  Dispense:  90 tablet  ?  Refill:  1  ? carvedilol (COREG) 12.5 MG tablet  ?  Sig: Take 1 tablet (12.5 mg total) by mouth 2 (two) times daily with a meal.  ?  Dispense:  180 tablet  ?  Refill:  1  ? atorvastatin (LIPITOR) 10 MG tablet  ?  Sig: Take 1 tablet (10 mg total) by mouth daily.  ?  Dispense:  90 tablet  ?  Refill:  0  ?  Order Specific Question:   Supervising Provider  ?  Answer:   Binnie Rail [[id number]]  ? ? ?Patient to follow-up in 6 weeks to check blood pressure, consider collecting A1c, and see how she is tolerating atorvastatin may also check lipid panel and metabolic panel. ? ?Ailene Ards, NP ? ?

## 2021-05-17 ENCOUNTER — Institutional Professional Consult (permissible substitution): Payer: Medicare Other | Admitting: Neurology

## 2021-05-25 ENCOUNTER — Ambulatory Visit (INDEPENDENT_AMBULATORY_CARE_PROVIDER_SITE_OTHER): Payer: BC Managed Care – PPO | Admitting: Nurse Practitioner

## 2021-05-25 VITALS — BP 128/82 | HR 58 | Temp 98.2°F | Ht 65.0 in | Wt 302.0 lb

## 2021-05-25 DIAGNOSIS — E785 Hyperlipidemia, unspecified: Secondary | ICD-10-CM

## 2021-05-25 DIAGNOSIS — Z1159 Encounter for screening for other viral diseases: Secondary | ICD-10-CM | POA: Diagnosis not present

## 2021-05-25 DIAGNOSIS — Z0001 Encounter for general adult medical examination with abnormal findings: Secondary | ICD-10-CM

## 2021-05-25 DIAGNOSIS — R7309 Other abnormal glucose: Secondary | ICD-10-CM

## 2021-05-25 DIAGNOSIS — Z13820 Encounter for screening for osteoporosis: Secondary | ICD-10-CM

## 2021-05-25 DIAGNOSIS — I10 Essential (primary) hypertension: Secondary | ICD-10-CM

## 2021-05-25 LAB — COMPREHENSIVE METABOLIC PANEL
ALT: 14 U/L (ref 0–35)
AST: 17 U/L (ref 0–37)
Albumin: 4.2 g/dL (ref 3.5–5.2)
Alkaline Phosphatase: 85 U/L (ref 39–117)
BUN: 15 mg/dL (ref 6–23)
CO2: 30 mEq/L (ref 19–32)
Calcium: 10.5 mg/dL (ref 8.4–10.5)
Chloride: 102 mEq/L (ref 96–112)
Creatinine, Ser: 0.89 mg/dL (ref 0.40–1.20)
GFR: 66.95 mL/min (ref >60.00)
Glucose, Bld: 99 mg/dL (ref 70–99)
Potassium: 3.7 mEq/L (ref 3.5–5.1)
Sodium: 140 mEq/L (ref 135–145)
Total Bilirubin: 0.5 mg/dL (ref 0.2–1.2)
Total Protein: 8.2 g/dL (ref 6.0–8.3)

## 2021-05-25 LAB — LIPID PANEL
Cholesterol: 148 mg/dL (ref 0–200)
HDL: 61.3 mg/dL (ref >39.00)
LDL Cholesterol: 75 mg/dL (ref 0–99)
NonHDL: 86.66
Total CHOL/HDL Ratio: 2
Triglycerides: 56 mg/dL (ref 0.0–149.0)
VLDL: 11.2 mg/dL (ref 0.0–40.0)

## 2021-05-25 LAB — HEMOGLOBIN A1C: Hgb A1c MFr Bld: 6.4 % (ref 4.6–6.5)

## 2021-05-25 NOTE — Assessment & Plan Note (Signed)
Chronic, patient tolerating atorvastatin 10 mg mouth daily well.  We will recheck fasting lipid panel and CMP, further recommendations may be made based upon these results. ?

## 2021-05-25 NOTE — Progress Notes (Signed)
? ?Established Patient Office Visit ? ?Subjective   ?Patient ID: Kaylee Carpenter, female    DOB: [birth year]  Age: 66 y.o. MRN: [id number] ? ?Chief Complaint  ?Patient presents with  ? 6 wk follow up  ?  Patient has no concerns or questions today, she states that you have taken good care of her.  ? ? ?Hyperlipidemia: Patient was started on atorvastatin 10 mg by mouth daily at last office visit.  She is due to have repeat fasting lipid panel and metabolic panel checked. Last Lipid Panel listed below. Patient is tolerating the medication well, and denies any negative side effects.  ?  ?     Component                Value               Date/Time            ?     CHOL                     220 (H)             03/20/2021 0929      ?     TRIG                     97.0                03/20/2021 0929      ?     HDL                      69.60               03/20/2021 0929      ?     CHOLHDL                  3                   03/20/2021 0929      ?     VLDL                     19.4                03/20/2021 0929      ?     LDLCALC                  131 (H)             03/20/2021 0929      ? ? ? ?Hypertension: She continues on her spironolactone 25 mg mouth daily, carvedilol 12.5 mg by mouth twice daily, and furosemide 40 mg a mouth daily.  She tolerates medication well.  She denies any chest pain, cardiac palpitations, new blurry vision, headaches, or shortness of breath. ? ?Elevated Glucose: At last office visit blood work was collected which showed mildly elevated glucose.  She be due to have A1c for further evaluation collected today. ? ?Past Medical History:  ?Diagnosis Date  ? Hypertension   ? Hyperthyroidism 2007  ? Graves Disease  ? OSA on CPAP   ? PVD (peripheral vascular disease) ([place])   ? Shortness of breath   ? due to edema and weight  ? Sleep apnea   ? uses a CPAP  ? ?Past Surgical History:  ?Procedure Laterality Date  ? CHOLECYSTECTOMY  1985  ? COLONOSCOPY WITH PROPOFOL N/A 02/24/2016  ?  Procedure: COLONOSCOPY WITH  PROPOFOL;  Surgeon: Carol Ada, MD;  Location: [facility];  Service: Endoscopy;  Laterality: N/A;  ? DILATATION & CURETTAGE/HYSTEROSCOPY WITH MYOSURE N/A 03/22/2016  ? Procedure: [place];  Surgeon: Servando Salina, MD;  Location: [facility];  Service: Gynecology;  Laterality: N/A;  ? DILATATION & CURRETTAGE/HYSTEROSCOPY WITH RESECTOCOPE N/A 09/17/2013  ? Procedure: DILATATION & CURETTAGE, HYSTEROSCOPY, CYSTOSCOPY;  Surgeon: Delice Lesch, MD;  Location: [facility];  Service: Gynecology;  Laterality: N/A;  ? DILATION AND CURETTAGE OF UTERUS    ? ?Family History  ?Problem Relation Age of Onset  ? Hypertension Mother   ? Colon cancer Father   ? ?Allergies  ?Allergen Reactions  ? Latex Other (See Comments)  ?  Top layer of skin came off  ? ?  ? ?Review of Systems  ?Eyes:  Negative for blurred vision.  ?Cardiovascular:  Negative for chest pain.  ?Neurological:  Negative for dizziness, loss of consciousness and headaches.  ? ?  ?Objective:  ?  ? ?BP 128/82 (BP Location: Right Arm, Patient Position: Sitting, Cuff Size: Large)   Pulse (!) 58   Temp 98.2 ?F (36.8 ?C) (Oral)   Ht '5\' 5"'  (1.651 m)   Wt (!) 302 lb (137 kg)   SpO2 97%   BMI 50.26 kg/m?  ?BP Readings from Last 3 Encounters:  ?05/25/21 128/82  ?04/07/21 (!) 142/98  ?03/17/21 (!) 150/90  ? ?  ? ?Physical Exam ?Vitals reviewed.  ?Constitutional:   ?   General: She is not in acute distress. ?   Appearance: Normal appearance.  ?HENT:  ?   Head: Normocephalic and atraumatic.  ?Neck:  ?   Vascular: No carotid bruit.  ?Cardiovascular:  ?   Rate and Rhythm: Normal rate and regular rhythm.  ?   Pulses: Normal pulses.  ?   Heart sounds: Normal heart sounds.  ?Pulmonary:  ?   Effort: Pulmonary effort is normal.  ?   Breath sounds: Normal breath sounds.  ?Skin: ?   General: Skin is warm and dry.  ?Neurological:  ?   General: No focal deficit present.  ?   Mental Status: She is alert and oriented to person, place, and time.   ?Psychiatric:     ?   Mood and Affect: Mood normal.     ?   Behavior: Behavior normal.     ?   Judgment: Judgment normal.  ? ? ? ?No results found for any visits on 05/25/21. ? ?Last metabolic panel ?Lab Results  ?Component Value Date  ? GLUCOSE 100 (H) 03/20/2021  ? NA 139 03/20/2021  ? K 3.5 03/20/2021  ? CL 103 03/20/2021  ? CO2 30 03/20/2021  ? BUN 12 03/20/2021  ? CREATININE 0.89 03/20/2021  ? GFRNONAA >60 03/21/2016  ? CALCIUM 9.9 03/20/2021  ? PROT 7.7 03/20/2021  ? ALBUMIN 4.0 03/20/2021  ? BILITOT 0.4 03/20/2021  ? ALKPHOS 80 03/20/2021  ? AST 16 03/20/2021  ? ALT 13 03/20/2021  ? ANIONGAP 8 03/21/2016  ? ?Last lipids ?Lab Results  ?Component Value Date  ? CHOL 220 (H) 03/20/2021  ? HDL 69.60 03/20/2021  ? LDLCALC 131 (H) 03/20/2021  ? TRIG 97.0 03/20/2021  ? CHOLHDL 3 03/20/2021  ? ?  ? ?The 10-year ASCVD risk score (Arnett DK, et al., 2019) is: 20.5% ? ?  ?Assessment & Plan:  ? ?Problem List Items Addressed This Visit   ? ?  ? Cardiovascular and Mediastinum  ? Hypertension  ?  Chronic, stable, at goal.  Patient will continue on her spironolactone 25 mg by mouth daily, carvedilol 12.5 mg by mouth twice daily, and furosemide 40 mg by mouth daily. ? ?  ?  ? Relevant Orders  ? Comp Met (CMET)  ? HgB A1c  ?  ? Other  ? Hyperlipidemia - Primary  ?  Chronic, patient tolerating atorvastatin 10 mg mouth daily well.  We will recheck fasting lipid panel and CMP, further recommendations may be made based upon these results. ? ?  ?  ? Relevant Orders  ? Lipid Profile  ? Comp Met (CMET)  ? Elevated glucose  ?  A1c ordered today for further evaluation, further recommendations may be made based upon these results. ? ?  ?  ? Relevant Orders  ? HgB A1c  ? Encounter for health maintenance examination with abnormal findings  ?  Patient due for osteoporosis screening via DEXA scan as well as hepatitis C screening.  Patient is agreeable to undergo both screenings, so hepatitis C antibody ordered today and DEXA scan ordered  today as well.  Further recommendations may be made based upon these results. ? ?  ?  ? ?Other Visit Diagnoses   ? ? Encounter for hepatitis C screening test for low risk patient      ? Relevant Orders  ? Hepatitis C Antibody  ? Osteoporosis screening      ? Relevant Orders  ? DG Bone Density  ? ?  ? ? ?Return in about 6 months (around 11/25/2021) for 3-6 months Annual physical with Judson Roch.  ? ? ?Ailene Ards, NP ? ?

## 2021-05-25 NOTE — Assessment & Plan Note (Signed)
Chronic, stable, at goal.  Patient will continue on her spironolactone 25 mg by mouth daily, carvedilol 12.5 mg by mouth twice daily, and furosemide 40 mg by mouth daily. ?

## 2021-05-25 NOTE — Assessment & Plan Note (Signed)
A1c ordered today for further evaluation, further recommendations may be made based upon these results. ?

## 2021-05-25 NOTE — Assessment & Plan Note (Signed)
Patient due for osteoporosis screening via DEXA scan as well as hepatitis C screening.  Patient is agreeable to undergo both screenings, so hepatitis C antibody ordered today and DEXA scan ordered today as well.  Further recommendations may be made based upon these results. ?

## 2021-05-26 LAB — HEPATITIS C ANTIBODY
Hepatitis C Ab: NONREACTIVE
SIGNAL TO CUT-OFF: 0.14 (ref <1.00)

## 2021-05-31 ENCOUNTER — Other Ambulatory Visit (HOSPITAL_BASED_OUTPATIENT_CLINIC_OR_DEPARTMENT_OTHER): Payer: BC Managed Care – PPO

## 2021-07-02 ENCOUNTER — Other Ambulatory Visit: Payer: Self-pay | Admitting: Nurse Practitioner

## 2021-07-02 DIAGNOSIS — E785 Hyperlipidemia, unspecified: Secondary | ICD-10-CM

## 2021-07-10 ENCOUNTER — Institutional Professional Consult (permissible substitution): Payer: Medicare Other | Admitting: Neurology

## 2021-07-10 ENCOUNTER — Encounter: Payer: Self-pay | Admitting: Neurology

## 2021-08-04 ENCOUNTER — Ambulatory Visit (INDEPENDENT_AMBULATORY_CARE_PROVIDER_SITE_OTHER): Payer: BC Managed Care – PPO | Admitting: Nurse Practitioner

## 2021-08-04 ENCOUNTER — Other Ambulatory Visit: Payer: Self-pay

## 2021-08-04 ENCOUNTER — Emergency Department (HOSPITAL_COMMUNITY)
Admission: EM | Admit: 2021-08-04 | Discharge: 2021-08-04 | Disposition: A | Discharge status: Home / Self Care | Payer: BC Managed Care – PPO | Attending: Emergency Medicine | Admitting: Emergency Medicine

## 2021-08-04 ENCOUNTER — Encounter (HOSPITAL_COMMUNITY): Payer: Self-pay

## 2021-08-04 VITALS — BP 126/80 | HR 56 | Temp 97.9°F | Ht 65.0 in | Wt 299.0 lb

## 2021-08-04 DIAGNOSIS — M79604 Pain in right leg: Secondary | ICD-10-CM

## 2021-08-04 DIAGNOSIS — R52 Pain, unspecified: Secondary | ICD-10-CM

## 2021-08-04 DIAGNOSIS — M7989 Other specified soft tissue disorders: Secondary | ICD-10-CM | POA: Diagnosis not present

## 2021-08-04 DIAGNOSIS — Z91040 Latex allergy status: Secondary | ICD-10-CM | POA: Diagnosis not present

## 2021-08-04 DIAGNOSIS — Z7982 Long term (current) use of aspirin: Secondary | ICD-10-CM | POA: Insufficient documentation

## 2021-08-04 DIAGNOSIS — S8011XA Contusion of right lower leg, initial encounter: Secondary | ICD-10-CM | POA: Diagnosis not present

## 2021-08-04 DIAGNOSIS — W19XXXA Unspecified fall, initial encounter: Secondary | ICD-10-CM | POA: Diagnosis not present

## 2021-08-04 LAB — CBC WITH DIFFERENTIAL/PLATELET
Abs Immature Granulocytes: 0.01 10*3/uL (ref 0.00–0.07)
Basophils Absolute: 0 10*3/uL (ref 0.0–0.1)
Basophils Relative: 1 %
Eosinophils Absolute: 0.1 10*3/uL (ref 0.0–0.5)
Eosinophils Relative: 2 %
HCT: 42.2 % (ref 36.0–46.0)
Hemoglobin: 13.6 g/dL (ref 12.0–15.0)
Immature Granulocytes: 0 %
Lymphocytes Relative: 39 %
Lymphs Abs: 2.3 10*3/uL (ref 0.7–4.0)
MCH: 27.5 pg (ref 26.0–34.0)
MCHC: 32.2 g/dL (ref 30.0–36.0)
MCV: 85.4 fL (ref 80.0–100.0)
Monocytes Absolute: 0.5 10*3/uL (ref 0.1–1.0)
Monocytes Relative: 9 %
Neutro Abs: 2.8 10*3/uL (ref 1.7–7.7)
Neutrophils Relative %: 49 %
Platelets: 314 10*3/uL (ref 150–400)
RBC: 4.94 MIL/uL (ref 3.87–5.11)
RDW: 14.7 % (ref 11.5–15.5)
WBC: 5.8 10*3/uL (ref 4.0–10.5)
nRBC: 0 % (ref 0.0–0.2)

## 2021-08-04 LAB — COMPREHENSIVE METABOLIC PANEL
ALT: 15 U/L (ref 0–44)
AST: 22 U/L (ref 15–41)
Albumin: 3.9 g/dL (ref 3.5–5.0)
Alkaline Phosphatase: 94 U/L (ref 38–126)
Anion gap: 12 (ref 5–15)
BUN: 11 mg/dL (ref 8–23)
CO2: 24 mmol/L (ref 22–32)
Calcium: 10.3 mg/dL (ref 8.9–10.3)
Chloride: 102 mmol/L (ref 98–111)
Creatinine, Ser: 0.97 mg/dL (ref 0.44–1.00)
GFR, Estimated: >60 mL/min (ref >60)
Glucose, Bld: 88 mg/dL (ref 70–99)
Potassium: 3.8 mmol/L (ref 3.5–5.1)
Sodium: 138 mmol/L (ref 135–145)
Total Bilirubin: 0.7 mg/dL (ref 0.3–1.2)
Total Protein: 7.9 g/dL (ref 6.5–8.1)

## 2021-08-04 LAB — D-DIMER, QUANTITATIVE: D-Dimer, Quant: 1.07 mcg/mL FEU — ABNORMAL HIGH (ref <0.50)

## 2021-08-04 NOTE — Progress Notes (Signed)
   Established Patient Office Visit  Subjective   Patient ID: Kaylee Carpenter, female    DOB: 11-Sep-1955  Age: 66 y.o. MRN: [id number]  Chief Complaint  Patient presents with   Bruise on back og leg    On right leg due to fall    Patient here for acute visit.  [place] onto concrete.  She fell forward and hit her right lower shin.  She ended up going to urgent care about 2 weeks after the fall for evaluation.  X-ray was completed that time which was negative for fracture.  She states that she was told if her bruise worsens she should follow-up with primary care provider.  She is here today because she feels that her bruise has been migrating in her leg and want to be evaluated.  Pain is still present but fairly mild and well controlled with as needed Tylenol and Motrin.  She is able to bear weight.    ROS: See HPI    Objective:     BP 126/80 (BP Location: Left Arm, Patient Position: Sitting, Cuff Size: Large)   Pulse (!) 56   Temp 97.9 F (36.6 C) (Oral)   Ht '5\' 5"'$  (1.651 m)   Wt 299 lb (135.6 kg)   SpO2 97%   BMI 49.76 kg/m    Physical Exam Vitals reviewed.  Constitutional:      General: She is not in acute distress.    Appearance: Normal appearance.  HENT:     Head: Normocephalic and atraumatic.  Neck:     Vascular: No carotid bruit.  Cardiovascular:     Rate and Rhythm: Normal rate and regular rhythm.     Pulses:          Dorsalis pedis pulses are 1+ on the right side.     Heart sounds: Normal heart sounds.  Pulmonary:     Effort: Pulmonary effort is normal.     Breath sounds: Normal breath sounds.  Musculoskeletal:     Right lower leg: Swelling (mild, chronic) present.     Left lower leg: No swelling.     Comments: No variability in diameter compared to other calf  Skin:    General: Skin is warm and dry.       Neurological:     General: No focal deficit present.     Mental Status: She is alert and  oriented to person, place, and time.  Psychiatric:        Mood and Affect: Mood normal.        Behavior: Behavior normal.        Judgment: Judgment normal.      No results found for any visits on 08/04/21.    The 10-year ASCVD risk score (Arnett DK, et al., 2019) is: 7.5%    Assessment & Plan:  Appears to be healing well. Will order d-dimer to rule out DVT (however I think low likelihood of this currently). Patient able to bear weight. Recommend to continue use of OTC tylenol/motrin as needed for pain control. Patient aware and agreeable to plan. Problem List Items Addressed This Visit   None Visit Diagnoses     Pain and swelling of lower extremity, right    -  Primary   Relevant Orders   D-Dimer, Quantitative       Return for As scheduled.    Ailene Ards, NP

## 2021-08-04 NOTE — ED Triage Notes (Signed)
Pt fell 4 weeks ago and since then has had pain in right leg.  Pt states she was sent to ED for possible blood clot. Pt c/o pain in leg, denies shortness of breath, nausea or vomiting.

## 2021-08-04 NOTE — ED Provider Triage Note (Signed)
Emergency Medicine Provider Triage Evaluation Note  Kaylee Carpenter , a 66 y.o. female  was evaluated in triage.  Pt complains of right leg pain.  She had x-rays at urgent care two weeks ago after a fall.  She went to her doctor who obtained a d-dimer this morning that was elevated and sent her here for a DVT study.    Physical Exam  BP 133/69 (BP Location: Right Arm)   Pulse (!) 57   Temp 98.6 F (37 C) (Oral)   Resp 18   Ht '5\' 6"'$  (1.676 m)   Wt 135.6 kg   SpO2 98%   BMI 48.26 kg/m  Gen:   Awake, no distress   Resp:  Normal effort  MSK:   Moves extremities without difficulty  Other:  Normal speech.   Medical Decision Making  Medically screening exam initiated at 7:31 PM.  Appropriate orders placed.  Kaylee Carpenter was informed that the remainder of the evaluation will be completed by another provider, this initial triage assessment does not replace that evaluation, and the importance of remaining in the ED until their evaluation is complete.  Note: Portions of this report may have been transcribed using voice recognition software. Every effort was made to ensure accuracy; however, inadvertent computerized transcription errors may be present  Given time of evaluation unable to order DVT study.  Will order basic labs,    Kaylee Carpenter 08/04/21 1938

## 2021-08-04 NOTE — ED Provider Notes (Signed)
[facility] EMERGENCY DEPARTMENT Provider Note   CSN: [id number] Arrival date & time: 08/04/21  1727     History  Chief Complaint  Patient presents with   Leg Pain    Kaylee Carpenter is a 66 y.o. female.  66 year old female with prior medical history as detailed below presents for evaluation Patient reports that she fell and bruised her right anterior shin approximately 2 weeks ago.  Patient complains of persistent pain to the right shin.  Apparently the patient's PCP was concerned about possible DVT.  D-dimer was drawn in the outpatient setting that was elevated.  Patient was sent to the ED for DVT rule out.  The history is provided by the patient and medical records.  Leg Pain Lower extremity pain location: Pain to right anterior shin.      Home Medications Prior to Admission medications   Medication Sig Start Date End Date Taking? Authorizing Provider  aspirin 81 MG tablet Take 81 mg by mouth daily as needed.    [provider]  atorvastatin (LIPITOR) 10 MG tablet Take 1 tablet (10 mg total) by mouth daily. 04/07/21   Ailene Ards, NP  carvedilol (COREG) 12.5 MG tablet Take 1 tablet (12.5 mg total) by mouth 2 (two) times daily with a meal. 04/07/21   Ailene Ards, NP  cholecalciferol (VITAMIN D) 1000 units tablet Take 1,000 Units by mouth daily.    [provider]  furosemide (LASIX) 40 MG tablet Take 1 tablet (40 mg total) by mouth daily. 04/07/21   Ailene Ards, NP  spironolactone (ALDACTONE) 25 MG tablet Take 1 tablet (25 mg total) by mouth daily. 04/07/21   Ailene Ards, NP  triamcinolone cream (KENALOG) 0.1 % Apply 1 application topically 2 (two) times daily as needed. legs 03/17/21   Ailene Ards, NP      Allergies    Latex    Review of Systems   Review of Systems  All other systems reviewed and are negative.   Physical Exam Updated Vital Signs BP (!) 153/83   Pulse (!) 58   Temp 98.6 F (37 C)   Resp 17   Ht  '5\' 6"'$  (1.676 m)   Wt 135.6 kg   SpO2 98%   BMI 48.26 kg/m  Physical Exam Vitals and nursing note reviewed.  Constitutional:      General: She is not in acute distress.    Appearance: Normal appearance. She is well-developed.  HENT:     Head: Normocephalic and atraumatic.  Eyes:     Conjunctiva/sclera: Conjunctivae normal.     Pupils: Pupils are equal, round, and reactive to light.  Cardiovascular:     Rate and Rhythm: Normal rate and regular rhythm.     Heart sounds: Normal heart sounds.  Pulmonary:     Effort: Pulmonary effort is normal. No respiratory distress.     Breath sounds: Normal breath sounds.  Abdominal:     General: There is no distension.     Palpations: Abdomen is soft.     Tenderness: There is no abdominal tenderness.  Musculoskeletal:        General: No deformity. Normal range of motion.     Cervical back: Normal range of motion and neck supple.     Comments: Healing contusion to anterior right shin.  No posterior calf pain or tenderness.  No significant edema to both lower extremities.  Patient is ambulatory with normal gait  Skin:    General:  Skin is warm and dry.  Neurological:     General: No focal deficit present.     Mental Status: She is alert and oriented to person, place, and time.     ED Results / Procedures / Treatments   Labs (all labs ordered are listed, but only abnormal results are displayed) Labs Reviewed  CBC WITH DIFFERENTIAL/PLATELET  COMPREHENSIVE METABOLIC PANEL    EKG None  Radiology No results found.  Procedures Procedures    Medications Ordered in ED Medications - No data to display  ED Course/ Medical Decision Making/ A&P                           Medical Decision Making   Medical Screen Complete  This patient presented to the ED with complaint of DVT rule out.  This complaint involves an extensive number of treatment options. The initial differential diagnosis includes, but is not limited to, contusion,  DVT, etc.  This presentation is: Acute, Self-Limited, Previously Undiagnosed, and Uncertain Prognosis  Patient is presenting for evaluation of possible DVT.  Ultrasound is not available at this time.  Patient's presentation is consistent with contusion to the anterior right shin.  Patient is educated about how to obtain next day ultrasound for DVT rule out.  Ultrasound order placed.  Patient understands need to return for imaging.  Screening labs obtained are without significant abnormality.  Exam is not consistent with likely DVT.    Additional history obtained:  External records from outside sources obtained and reviewed including prior ED visits and prior Inpatient records.    Lab Tests:  I ordered and personally interpreted labs.  The pertinent results include: CBC, CMP   Problem List / ED Course:  Right leg pain, rule out DVT   Reevaluation:  After the interventions noted above, I reevaluated the patient and found that they have: stayed the same  Disposition:  After consideration of the diagnostic results and the patients response to treatment, I feel that the patent would benefit from close outpatient follow-up.          Final Clinical Impression(s) / ED Diagnoses Final diagnoses:  Right leg pain    Rx / DC Orders ED Discharge Orders          Ordered    UE VENOUS DUPLEX        08/04/21 2127              Valarie Merino, MD 08/04/21 2236

## 2021-08-04 NOTE — Discharge Instructions (Signed)
IMPORTANT PATIENT INSTRUCTIONS:  You have been scheduled for an Outpatient Vascular Study at [facility].    If tomorrow is a Saturday, Sunday or holiday, please go to the [facility] Emergency Department Registration Desk at 11 am tomorrow morning and tell them you are there for a vascular study.   If tomorrow is a weekday (Monday-Friday), please go to [facility] [place] at 11 am and tell them you are there for a vascular study.

## 2021-08-05 ENCOUNTER — Ambulatory Visit (HOSPITAL_COMMUNITY)
Admission: RE | Admit: 2021-08-05 | Discharge: 2021-08-05 | Disposition: A | Discharge status: Home / Self Care | Payer: BC Managed Care – PPO | Source: Ambulatory Visit | Attending: Emergency Medicine | Admitting: Emergency Medicine

## 2021-08-05 DIAGNOSIS — R52 Pain, unspecified: Secondary | ICD-10-CM

## 2021-08-05 DIAGNOSIS — M79606 Pain in leg, unspecified: Secondary | ICD-10-CM | POA: Diagnosis not present

## 2021-08-25 ENCOUNTER — Ambulatory Visit (INDEPENDENT_AMBULATORY_CARE_PROVIDER_SITE_OTHER): Payer: BC Managed Care – PPO | Admitting: Nurse Practitioner

## 2021-08-25 VITALS — BP 122/76 | HR 61 | Temp 97.7°F | Ht 66.0 in | Wt 304.0 lb

## 2021-08-25 DIAGNOSIS — Z1231 Encounter for screening mammogram for malignant neoplasm of breast: Secondary | ICD-10-CM | POA: Diagnosis not present

## 2021-08-25 DIAGNOSIS — Z0001 Encounter for general adult medical examination with abnormal findings: Secondary | ICD-10-CM

## 2021-08-25 DIAGNOSIS — B3789 Other sites of candidiasis: Secondary | ICD-10-CM

## 2021-08-25 DIAGNOSIS — E559 Vitamin D deficiency, unspecified: Secondary | ICD-10-CM | POA: Diagnosis not present

## 2021-08-25 DIAGNOSIS — Z23 Encounter for immunization: Secondary | ICD-10-CM

## 2021-08-25 DIAGNOSIS — Z1211 Encounter for screening for malignant neoplasm of colon: Secondary | ICD-10-CM | POA: Insufficient documentation

## 2021-08-25 MED ORDER — CLOTRIMAZOLE 1 % EX CREA: 1.0000 | TOPICAL_CREAM | Freq: Two times a day (BID) | CUTANEOUS | 0 refills | Status: DC

## 2021-08-25 NOTE — Progress Notes (Signed)
Established Patient Office Visit  Subjective   Patient ID: Kaylee Carpenter, female    DOB: 04/11/1955  Age: 66 y.o. MRN: [id number]  Chief Complaint  Patient presents with   Physical    No concerns    No acute concerns. Reports no history of abnormal pap, used to go to [facility] for annual pap. Due for mammogram, reports last one was longer than 2 years ago. Was due for screening colonoscopy this last February, postponed it due to having had a death in the family. Would be willing to go now, sees Dr. Carol Ada for GI. Denies ever receiving pneumonia vaccine. Interested in shingles vaccine.  Hep C screening already completed.  Reports she has a rash under her breast folds that itches at times.  She like this to be evaluated.  Not painful.    Review of Systems  Constitutional:  Negative for fever, malaise/fatigue and weight loss.  Respiratory:  Negative for cough and shortness of breath.   Cardiovascular:  Negative for chest pain.  Gastrointestinal:  Negative for abdominal pain, blood in stool, constipation, diarrhea and nausea.  Neurological:  Negative for dizziness and loss of consciousness.  Psychiatric/Behavioral:  Negative for depression and suicidal ideas.       Objective:     BP 122/76 (BP Location: Left Arm, Patient Position: Sitting, Cuff Size: Large)   Pulse 61   Temp 97.7 F (36.5 C) (Oral)   Ht '5\' 6"'$  (1.676 m)   Wt (!) 304 lb (137.9 kg)   SpO2 98%   BMI 49.07 kg/m    Physical Exam Vitals reviewed. Exam conducted with a chaperone present.  Constitutional:      Appearance: Normal appearance.  HENT:     Head: Normocephalic and atraumatic.     Right Ear: Tympanic membrane, ear canal and external ear normal.     Left Ear: Tympanic membrane, ear canal and external ear normal.  Eyes:     General:        Right eye: No discharge.        Left eye: No discharge.     Extraocular Movements: Extraocular movements intact.     Conjunctiva/sclera:  Conjunctivae normal.     Pupils: Pupils are equal, round, and reactive to light.  Neck:     Vascular: No carotid bruit.  Cardiovascular:     Rate and Rhythm: Normal rate and regular rhythm.     Pulses: Normal pulses.     Heart sounds: Normal heart sounds. No murmur heard. Pulmonary:     Effort: Pulmonary effort is normal.     Breath sounds: Normal breath sounds.  Chest:  Breasts:    Breasts are symmetrical.     Right: Normal.     Left: Normal.       Comments: Hyperpigmentation consistent with cadidiasis Abdominal:     General: Abdomen is flat. Bowel sounds are normal. There is no distension.     Palpations: Abdomen is soft. There is no mass.     Tenderness: There is no abdominal tenderness.  Musculoskeletal:        General: No tenderness.     Cervical back: Neck supple. No muscular tenderness.     Right lower leg: No edema.     Left lower leg: No edema.  Lymphadenopathy:     Cervical: No cervical adenopathy.     Upper Body:     Right upper body: No supraclavicular adenopathy.     Left upper body: No supraclavicular  adenopathy.  Skin:    General: Skin is warm and dry.  Neurological:     General: No focal deficit present.     Mental Status: She is alert and oriented to person, place, and time.     Motor: No weakness.     Gait: Gait normal.  Psychiatric:        Mood and Affect: Mood normal.        Behavior: Behavior normal.        Judgment: Judgment normal.      No results found for any visits on 08/25/21.    The 10-year ASCVD risk score (Arnett DK, et al., 2019) is: 7%    Assessment & Plan:   Problem List Items Addressed This Visit       Other   Need for vaccination    Prevnar 20 administered today.      Relevant Orders   Pneumococcal conjugate vaccine 20-valent (Prevnar 20) (Completed)   Encounter for screening mammogram for malignant neoplasm of breast - Primary    Bilateral screening mammogram ordered today.  Further recommendations may be made  based upon these results.      Relevant Orders   MM DIGITAL SCREENING BILATERAL   VITAMIN D 25 Hydroxy (Vit-D Deficiency, Fractures)   Basic metabolic panel   Vitamin D deficiency    We will collect serum vitamin D level and calcium level, further recommendations may be made based upon these results.      Relevant Orders   VITAMIN D 25 Hydroxy (Vit-D Deficiency, Fractures)   Basic metabolic panel   Need for prophylactic vaccination with Streptococcus pneumoniae (Pneumococcus) and Influenza vaccines    Prevnar 20 administered today.      Candidiasis of breast    Recommend application of over-the-counter clotrimazole twice a day for up to 2 weeks, patient told to call if symptoms persist or worsen.      Relevant Medications   clotrimazole (LOTRIMIN) 1 % cream   Encounter for general adult medical examination with abnormal findings    We will order bilateral screening mammogram, recommend she call GI to reschedule screening colonoscopy.  Recommend she consider shingles vaccine to be administered at her pharmacy.  We will administer Prevnar 20 today, patient educated about signs and symptoms of allergic reaction and to call 911 if these occur.  Encouraged general healthy lifestyle habits such as eating healthy diet specifically Mediterranean diet and to participate in 150 minutes of moderate intensity activity per week.       Return in about 3 months (around 11/25/2021) for F/u 3-6 months with Judson Roch; CPE in 1 year.    Ailene Ards, NP

## 2021-08-25 NOTE — Assessment & Plan Note (Signed)
Recommend application of over-the-counter clotrimazole twice a day for up to 2 weeks, patient told to call if symptoms persist or worsen.

## 2021-08-25 NOTE — Assessment & Plan Note (Signed)
We will collect serum vitamin D level and calcium level, further recommendations may be made based upon these results.

## 2021-08-25 NOTE — Assessment & Plan Note (Addendum)
We will order bilateral screening mammogram, recommend she call GI to reschedule screening colonoscopy.  Recommend she consider shingles vaccine to be administered at her pharmacy.  We will administer Prevnar 20 today, patient educated about signs and symptoms of allergic reaction and to call 911 if these occur.  Encouraged general healthy lifestyle habits such as eating healthy diet specifically Mediterranean diet and to participate in 150 minutes of moderate intensity activity per week.

## 2021-08-25 NOTE — Assessment & Plan Note (Signed)
Prevnar 20 administered today.

## 2021-08-25 NOTE — Assessment & Plan Note (Signed)
Bilateral screening mammogram ordered today.  Further recommendations may be made based upon these results.

## 2021-09-29 ENCOUNTER — Ambulatory Visit: Payer: BC Managed Care – PPO

## 2021-10-04 ENCOUNTER — Other Ambulatory Visit: Payer: Self-pay | Admitting: Nurse Practitioner

## 2021-10-04 DIAGNOSIS — I10 Essential (primary) hypertension: Secondary | ICD-10-CM

## 2021-10-17 ENCOUNTER — Inpatient Hospital Stay: Admission: RE | Admit: 2021-10-17 | Payer: BC Managed Care – PPO | Source: Ambulatory Visit

## 2021-10-26 ENCOUNTER — Telehealth: Payer: Self-pay

## 2021-10-26 NOTE — Telephone Encounter (Signed)
MEDICATION: spironolactone (ALDACTONE) 25 MG tablet // carvedilol (COREG) 12.5 MG tablet // furosemide (LASIX) 40 MG tablet  PHARMACY:  [facility] [phone] - [place], [place] - [address] AT [address]  Comments: Patient is completely out of Carvedilol   **Let patient know to contact pharmacy at the end of the day to make sure medication is ready. **  ** Please notify patient to allow 48-72 hours to process**  **Encourage patient to contact the pharmacy for refills or they can request refills through [facility]**

## 2021-11-03 ENCOUNTER — Other Ambulatory Visit: Payer: Self-pay | Admitting: Nurse Practitioner

## 2021-11-03 DIAGNOSIS — I10 Essential (primary) hypertension: Secondary | ICD-10-CM

## 2021-11-16 ENCOUNTER — Telehealth: Payer: Self-pay

## 2021-11-16 ENCOUNTER — Ambulatory Visit: Payer: Medicare Other

## 2021-11-16 NOTE — Telephone Encounter (Signed)
Called patient twice and lvm to return call, to complete AWV.  Patient may reschedule for the next available appointment [facility] or [facility]. -S. Dempsey Ahonen,LPN

## 2021-11-16 NOTE — Telephone Encounter (Signed)
Medication was sent to pharmacy on 11/03/21

## 2021-11-21 ENCOUNTER — Telehealth: Payer: Self-pay | Admitting: Nurse Practitioner

## 2021-11-21 NOTE — Telephone Encounter (Signed)
Called patient and was informed that I had the wrong number by the person who answered.  No hx of AWV  Please schedule at anytime with [facility] if patient calls the office back.     Any questions, please call me at [phone]

## 2021-11-28 ENCOUNTER — Ambulatory Visit: Payer: BC Managed Care – PPO

## 2022-01-25 ENCOUNTER — Ambulatory Visit: Payer: Commercial Managed Care - PPO | Admitting: Nurse Practitioner

## 2022-01-25 ENCOUNTER — Telehealth: Payer: Self-pay | Admitting: Nurse Practitioner

## 2022-01-25 VITALS — BP 120/78 | HR 67 | Temp 97.7°F | Ht 66.0 in | Wt 308.5 lb

## 2022-01-25 DIAGNOSIS — R935 Abnormal findings on diagnostic imaging of other abdominal regions, including retroperitoneum: Secondary | ICD-10-CM | POA: Diagnosis not present

## 2022-01-25 DIAGNOSIS — R590 Localized enlarged lymph nodes: Secondary | ICD-10-CM | POA: Diagnosis not present

## 2022-01-25 DIAGNOSIS — M79604 Pain in right leg: Secondary | ICD-10-CM | POA: Diagnosis not present

## 2022-01-25 DIAGNOSIS — I739 Peripheral vascular disease, unspecified: Secondary | ICD-10-CM | POA: Diagnosis not present

## 2022-01-25 DIAGNOSIS — M7989 Other specified soft tissue disorders: Secondary | ICD-10-CM

## 2022-01-25 NOTE — Assessment & Plan Note (Signed)
Chronic, recommend she follow back up with Dr. Scot Dock to determine if her symptoms may be a sign of progression of her vascular disease.  Patient reports understanding.  Referral made also patient was given his office phone number as she should still be a current patient of his to schedule an appointment.

## 2022-01-25 NOTE — Assessment & Plan Note (Signed)
Transvaginal pelvic ultrasound ordered, further recommendations may be made based upon these results.

## 2022-01-25 NOTE — Telephone Encounter (Signed)
Kaylee Carpenter with [facility] calls today in regards to recent CT referral placed and clarification on this referral.  They are wanting to make sure that pelvic complete with transvaginal was the correct order based on the diagnosis that they had received (length adenopathy of groin and chronic lower leg swelling)?  CB: [id number] Option 1 and then option 5

## 2022-01-25 NOTE — Assessment & Plan Note (Signed)
Noted on ultrasound report from July 2023, would like to rule out any pelvic neoplasm so we will order transvaginal pelvic ultrasound for further evaluation.  Further recommendations may be made based upon these results.

## 2022-01-25 NOTE — Patient Instructions (Addendum)
Call Dr. Scot Dock at [phone]  Tylenol: 500-'1000mg'$  by mouth at bedtime If tylenol does not help take ibuprofen '400mg'$  by mouth at bedtime

## 2022-01-25 NOTE — Assessment & Plan Note (Signed)
Etiology unclear, favor most likely progression of peripheral vascular disease.  Recommend patient use compression stockings daily, continue to elevate, use Tylenol as needed at bedtime if Tylenol does not help pain may trial ibuprofen.  Patient was educated on potential risks of long-term use of ibuprofen.  Will also refer patient to sports medicine to determine if there could be a component of injury from her fall 6 months ago contributing to her pain.

## 2022-01-25 NOTE — Progress Notes (Signed)
Established Patient Office Visit  Subjective   Patient ID: Kaylee Carpenter, female    DOB: January 25, 1955  Age: 67 y.o. MRN: [id number]  Chief Complaint  Patient presents with   right leg pain and swelling    Symptom onset 6 months ago after experiencing a fall on vacation.  She was walking off a sidewalk when she fell to the ground, she fell forward on right shin and knee.  Was evaluated by urgent care about 2 weeks after fall occurred, and reports x-rays were done with no fractures noted.   Since then she reports chronic intermittent pain to the right lower leg as well as swelling.  She reports pain can be 5-10/10 in intensity describes it as throbbing.  Hurts mostly at night when laying down.  Reports that she is able to walk without experiencing pain and has not been experiencing any instability in knee or ankle.  Does not have tenderness to palpation.  Does experience numbness in her fifth digit of her right foot which is constant.  Will have pain in her heel when it touches her mattress at night.  Has history of prediabetes but is not diabetic.  Did have evaluation in the emergency department in July which ruled out DVT, there was evidence of lymphadenopathy in the groin.  Has not taken anything for the pain as of yet.  She has had lower leg discoloration and swelling chronically she does have history of peripheral vascular disease and has seen Dr. Scot Dock with vascular in the past (04/2020).  He had recommended use of compression stockings and elevation of leg as well as exercise with walking and water aerobics as tolerated and to follow back up with him if symptoms progress.    Review of Systems  Constitutional:  Negative for malaise/fatigue and weight loss.  See HPI as well    Objective:     BP 120/78   Pulse 67   Temp 97.7 F (36.5 C) (Oral)   Ht '5\' 6"'$  (1.676 m)   Wt (!) 308 lb 8 oz (139.9 kg)   SpO2 92%   BMI 49.79 kg/m  BP Readings from Last 3 Encounters:  01/25/22  120/78  08/25/21 122/76  08/04/21 (!) 153/83   Wt Readings from Last 3 Encounters:  01/25/22 (!) 308 lb 8 oz (139.9 kg)  08/25/21 (!) 304 lb (137.9 kg)  08/04/21 299 lb (135.6 kg)    Physical Exam Vitals reviewed.  Constitutional:      General: She is not in acute distress.    Appearance: Normal appearance.  HENT:     Head: Normocephalic and atraumatic.  Neck:     Vascular: No carotid bruit.  Cardiovascular:     Rate and Rhythm: Normal rate and regular rhythm.     Pulses:          Dorsalis pedis pulses are 2+ on the right side.     Heart sounds: Normal heart sounds.  Pulmonary:     Effort: Pulmonary effort is normal.     Breath sounds: Normal breath sounds.  Musculoskeletal:     Right knee: Normal.     Left knee: Normal.     Right lower leg: Swelling present. No tenderness or bony tenderness.     Left lower leg: Swelling present. No tenderness or bony tenderness.     Right ankle: Swelling present. No tenderness. Normal pulse.     Left ankle: Swelling present.     Right foot: Normal capillary refill. Swelling  present. No tenderness or bony tenderness. Normal pulse.  Feet:     Right foot:     Skin integrity: No skin breakdown or erythema.  Skin:    General: Skin is warm and dry.          Comments: Does have hyperpigmentation as well as swelling to right lower extremity  Neurological:     General: No focal deficit present.     Mental Status: She is alert and oriented to person, place, and time.  Psychiatric:        Mood and Affect: Mood normal.        Behavior: Behavior normal.        Judgment: Judgment normal.      No results found for any visits on 01/25/22.    The 10-year ASCVD risk score (Arnett DK, et al., 2019) is: 7.2%    Assessment & Plan:   Problem List Items Addressed This Visit       Cardiovascular and Mediastinum   Peripheral vascular disease ([place])    Chronic, recommend she follow back up with Dr. Scot Dock to determine if her symptoms may be  a sign of progression of her vascular disease.  Patient reports understanding.  Referral made also patient was given his office phone number as she should still be a current patient of his to schedule an appointment.      Relevant Orders   Ambulatory referral to Vascular Surgery     Immune and Lymphatic   Lymphadenopathy, inguinal    Noted on ultrasound report from July 2023, would like to rule out any pelvic neoplasm so we will order transvaginal pelvic ultrasound for further evaluation.  Further recommendations may be made based upon these results.      Relevant Orders   US Pelvic Complete With Transvaginal     Other   Pain and swelling of lower extremity, right - Primary    Etiology unclear, favor most likely progression of peripheral vascular disease.  Recommend patient use compression stockings daily, continue to elevate, use Tylenol as needed at bedtime if Tylenol does not help pain may trial ibuprofen.  Patient was educated on potential risks of long-term use of ibuprofen.  Will also refer patient to sports medicine to determine if there could be a component of injury from her fall 6 months ago contributing to her pain.      Relevant Orders   Ambulatory referral to Sports Medicine   Ambulatory referral to Vascular Surgery   Abnormal findings on diagnostic imaging of other abdominal regions, including retroperitoneum    Transvaginal pelvic ultrasound ordered, further recommendations may be made based upon these results.      Relevant Orders   US Pelvic Complete With Transvaginal    Return in about 6 weeks (around 03/08/2022) for F/u with Judson Roch.  Total time spent on encounter today was 43 minutes including face-to-face evaluation with patient, review of previous medical records, and development of treatment plan.   Ailene Ards, NP

## 2022-01-31 ENCOUNTER — Ambulatory Visit (INDEPENDENT_AMBULATORY_CARE_PROVIDER_SITE_OTHER): Payer: Commercial Managed Care - PPO | Admitting: Family Medicine

## 2022-01-31 ENCOUNTER — Ambulatory Visit: Payer: Self-pay

## 2022-01-31 ENCOUNTER — Ambulatory Visit: Payer: Commercial Managed Care - PPO

## 2022-01-31 VITALS — BP 122/78 | Ht 66.0 in | Wt 311.0 lb

## 2022-01-31 DIAGNOSIS — M5416 Radiculopathy, lumbar region: Secondary | ICD-10-CM

## 2022-01-31 DIAGNOSIS — R600 Localized edema: Secondary | ICD-10-CM | POA: Diagnosis not present

## 2022-01-31 MED ORDER — GABAPENTIN 100 MG PO CAPS: 100.0000 mg | ORAL_CAPSULE | Freq: Every evening | ORAL | 3 refills | Status: DC | PRN

## 2022-01-31 NOTE — Progress Notes (Unsigned)
I, Peterson Lombard, LAT, ATC acting as a scribe for Lynne Leader, MD.  Subjective:    CC: R leg pain  HPI: Pt is a 67 y/o female presenting with right leg pain.  She suffered a fall about 6 months ago, while on vacation, hitting her right shin and right knee.  Patient was seen at the [facility] ED on 08/04/2021 after D-dimer lab draw by her PCP, was elevated, to rule out DVT.  Patient locates pain to the anterior lateral aspect of her R lower leg. Pt also c/o pain along the posterior aspect of her R calcaneous. Pt notes numbness in all of her R toes. Swelling present in R lower leg.  Marland Kitchen Her symptoms are chronic and ongoing for last 6 months.  She has had multiple trials of evaluation and treatment from her primary care team.  Dx testing: 08/05/21 LE vasc US  08/04/20 Labs  04/28/20 LE vasc US reflux  Pertinent review of Systems: No fevers or chills.  Relevant historical information: Lower extremity edema   Objective:    Vitals:   01/31/22 1543  BP: 122/78   General: Well Developed, well nourished, and in no acute distress.   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  Mildly tender palpation along the lateral calf and ankle. Sensation is intact to light touch.  L-spine: Normal. Nontender to palpation spinal midline. Normal lumbar motion. Strength is intact.  Reflexes are intact.  Lab and Radiology Results  X-ray images lumbar spine obtained today personally and independently interpreted. 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  DDD L4-5 and L5-S1.  No acute fractures are visible. Await formal radiology review   Impression and Recommendations:    Assessment and Plan: 67 y.o. female with right leg pain and paresthesias.  Symptoms are consistent predominantly with L5 lumbar radiculopathy.  This has been ongoing for months and has not resolved with trials of alternative treatment including compression  stockings for her venous stasis dermatitis and edema as well as some home exercise program dictated by her primary care provider.  At this point I think is worthwhile to pursue a lumbar radicular workup including x-ray lumbar spine and MRI lumbar spine.  If this fails to further explain her paresthesias and pain would recommend nerve conduction study.  Additionally will add gabapentin at bedtime as that may be helpful.  She has been referred to vascular surgery to help treat her leg swelling.  Compression stockings have been recommended.  She finds this challenging to use.  I think that skin to be a big part of her treatment strategies.  If she just cannot use compression stockings PT referral for lymphedema may be reasonable.Marland Kitchen  PDMP not reviewed this encounter. Orders Placed This Encounter  Procedures   DG Lumbar Spine 2-3 Views    Standing Status:   Future    Number of Occurrences:   1    Standing Expiration Date:   02/01/2023    Order Specific Question:   Reason for Exam (SYMPTOM  OR DIAGNOSIS REQUIRED)    Answer:   eval rt lumbar rad    Order Specific Question:   Preferred imaging location?    Answer:   [facility]   MR Lumbar Spine Wo Contrast    Standing Status:   Future    Standing Expiration Date:   02/01/2023    Order Specific Question:   What is the patient's sedation  requirement?    Answer:   No Sedation    Order Specific Question:   Does the patient have a pacemaker or implanted devices?    Answer:   No    Order Specific Question:   Preferred imaging location?    Answer:   [facility] (table limit-550lbs)   Meds ordered this encounter  Medications   gabapentin (NEURONTIN) 100 MG capsule    Sig: Take 1-3 capsules (100-300 mg total) by mouth at bedtime as needed (nerve pain).    Dispense:  90 capsule    Refill:  3    Discussed warning signs or symptoms. Please see discharge instructions. Patient expresses understanding.   The above documentation has  been reviewed and is accurate and complete Lynne Leader, M.D.

## 2022-01-31 NOTE — Patient Instructions (Signed)
Thank you for coming in today.   Please get an Xray today before you leave   You should hear from MRI scheduling within 1 week. If you do not hear please let me know.    Try gabapentin at bedtime as needed for nerve pain. Take 1 at bedtime and increase to up to 3 and see if its more helpful than its annoying.   Recheck after the MRI.

## 2022-02-01 NOTE — Telephone Encounter (Signed)
They called and Judson Roch gave an ok for the Ultrasound pelvic limited

## 2022-02-05 NOTE — Progress Notes (Signed)
Xray lumbar spine shows mild arthritis.

## 2022-02-11 ENCOUNTER — Other Ambulatory Visit: Payer: Medicare Other

## 2022-02-18 ENCOUNTER — Ambulatory Visit
Admission: RE | Admit: 2022-02-18 | Discharge: 2022-02-18 | Disposition: A | Discharge status: Home / Self Care | Payer: Medicare Other | Source: Ambulatory Visit | Attending: Family Medicine | Admitting: Family Medicine

## 2022-02-18 DIAGNOSIS — M5416 Radiculopathy, lumbar region: Secondary | ICD-10-CM

## 2022-02-19 ENCOUNTER — Telehealth: Payer: Self-pay

## 2022-02-19 ENCOUNTER — Telehealth: Payer: Self-pay | Admitting: Family Medicine

## 2022-02-19 DIAGNOSIS — M5416 Radiculopathy, lumbar region: Secondary | ICD-10-CM

## 2022-02-19 DIAGNOSIS — C763 Malignant neoplasm of pelvis: Secondary | ICD-10-CM

## 2022-02-19 DIAGNOSIS — M7989 Other specified soft tissue disorders: Secondary | ICD-10-CM

## 2022-02-19 NOTE — Telephone Encounter (Signed)
Pt was unable to complete MRI. She was not aware of her claustrophobia until this experience and does NOT think she could get thru it with meds.  Would like to have this done in an open MRI.

## 2022-02-19 NOTE — Telephone Encounter (Signed)
Patient called back to follow up.

## 2022-02-19 NOTE — Telephone Encounter (Signed)
[facility], wanting a clarification on what order for tomorrow. They stated that the order put in will only allow them to check pt pelvic which will be ( ovaries and uterus) But the comment under the orders is showing the test need to look at  pt lymph node on the groin which was based on her previous results.  Stated these are two different  exams and the order put in will only let them  look at  the pelvic (the ovaries and the uterus)  Want to know which one, and if it id for them to look for the lymph nodes, they will go ahead and change the order

## 2022-02-20 ENCOUNTER — Other Ambulatory Visit: Payer: Medicare Other

## 2022-02-20 MED ORDER — LORAZEPAM 0.5 MG PO TABS: ORAL_TABLET | ORAL | 0 refills | Status: DC

## 2022-02-20 NOTE — Telephone Encounter (Signed)
Called [facility] spoke w/ Theadora Rama gave her Judson Roch response. She states she would need a Pelvic complete Transvaginal. She cancel the order that was put in. She states will call pt and schedule appt.Marland KitchenJohny Chess

## 2022-02-20 NOTE — Telephone Encounter (Signed)
I called Kaylee Carpenter back. We will try a MRI with oral sedation

## 2022-03-09 ENCOUNTER — Ambulatory Visit
Admission: RE | Admit: 2022-03-09 | Discharge: 2022-03-09 | Disposition: A | Discharge status: Home / Self Care | Payer: Medicare Other | Source: Ambulatory Visit | Attending: Nurse Practitioner | Admitting: Nurse Practitioner

## 2022-03-09 DIAGNOSIS — M79604 Pain in right leg: Secondary | ICD-10-CM

## 2022-03-09 DIAGNOSIS — C763 Malignant neoplasm of pelvis: Secondary | ICD-10-CM

## 2022-03-14 ENCOUNTER — Other Ambulatory Visit: Payer: Self-pay | Admitting: Nurse Practitioner

## 2022-03-14 DIAGNOSIS — R9389 Abnormal findings on diagnostic imaging of other specified body structures: Secondary | ICD-10-CM

## 2022-03-14 NOTE — Progress Notes (Signed)
Referral to Dr. Garwin Brothers on behalf of patient for her to undergo additional evaluation/testing regarding finding of thickened endometrial lining on ultrasound. Per patient she has seen Dr. Garwin Brothers in the past.

## 2022-03-17 ENCOUNTER — Telehealth: Payer: Self-pay

## 2022-03-17 DIAGNOSIS — R52 Pain, unspecified: Secondary | ICD-10-CM

## 2022-03-17 DIAGNOSIS — I878 Other specified disorders of veins: Secondary | ICD-10-CM

## 2022-03-17 DIAGNOSIS — I872 Venous insufficiency (chronic) (peripheral): Secondary | ICD-10-CM

## 2022-03-17 NOTE — Telephone Encounter (Signed)
Called pt to schedule referral, two identifiers used. Appts for [place] and Dr. Scot Dock scheduled at earliest available for the latest of the day, per pt request. Confirmed understanding.

## 2022-03-18 ENCOUNTER — Ambulatory Visit
Admission: RE | Admit: 2022-03-18 | Discharge: 2022-03-18 | Disposition: A | Discharge status: Home / Self Care | Payer: Medicare Other | Source: Ambulatory Visit | Attending: Family Medicine | Admitting: Family Medicine

## 2022-03-18 DIAGNOSIS — M5416 Radiculopathy, lumbar region: Secondary | ICD-10-CM

## 2022-03-22 ENCOUNTER — Ambulatory Visit (INDEPENDENT_AMBULATORY_CARE_PROVIDER_SITE_OTHER): Payer: Medicare Other | Admitting: Nurse Practitioner

## 2022-03-22 VITALS — BP 124/72 | HR 63 | Temp 98.2°F | Ht 66.0 in | Wt 312.4 lb

## 2022-03-22 DIAGNOSIS — M7989 Other specified soft tissue disorders: Secondary | ICD-10-CM | POA: Diagnosis not present

## 2022-03-22 DIAGNOSIS — R239 Unspecified skin changes: Secondary | ICD-10-CM | POA: Diagnosis not present

## 2022-03-22 DIAGNOSIS — M79604 Pain in right leg: Secondary | ICD-10-CM | POA: Diagnosis not present

## 2022-03-22 DIAGNOSIS — R2241 Localized swelling, mass and lump, right lower limb: Secondary | ICD-10-CM | POA: Diagnosis not present

## 2022-03-22 NOTE — Assessment & Plan Note (Signed)
See plan under problem "pain and swelling of lower extremity"

## 2022-03-22 NOTE — Progress Notes (Signed)
Established Patient Office Visit  Subjective   Patient ID: Kaylee Carpenter, female    DOB: 02-01-1955  Age: 67 y.o. MRN: [id number]  Chief Complaint  Patient presents with   leg mass    Patient arrives today for follow-up regarding leg pain and swelling. Was told to wear compression stockings, when wearing has less pain and swelling. Saw Dr. Georgina Snell underwent MRI which found evidence of pinched nerves in low back. Recommended considering back injections for treatment. Scheduled to see Dr. Scot Dock for evaluation of her swelling next month as well. Today she reports a "knot" located to the medial aspect of her lower shin. The area is not painful. Her friend did Patent examiner and thinks she may have erythema nodosum. This "knot" has been present for years.   HPI 01/25/22: Symptom onset 6 months ago after experiencing a fall on vacation.  She was walking off a sidewalk when she fell to the ground, she fell forward on right shin and knee.  Was evaluated by urgent care about 2 weeks after fall occurred, and reports x-rays were done with no fractures noted.    Since then she reports chronic intermittent pain to the right lower leg as well as swelling.  She reports pain can be 5-10/10 in intensity describes it as throbbing.  Hurts mostly at night when laying down.  Reports that she is able to walk without experiencing pain and has not been experiencing any instability in knee or ankle.  Does not have tenderness to palpation.  Does experience numbness in her fifth digit of her right foot which is constant.  Will have pain in her heel when it touches her mattress at night.  Has history of prediabetes but is not diabetic.  Did have evaluation in the emergency department in July which ruled out DVT, there was evidence of lymphadenopathy in the groin.  Has not taken anything for the pain as of yet.   She has had lower leg discoloration and swelling chronically she does have history of peripheral vascular  disease and has seen Dr. Scot Dock with vascular in the past (04/2020).  He had recommended use of compression stockings and elevation of leg as well as exercise with walking and water aerobics as tolerated and to follow back up with him if symptoms progress.    ROS: See HPI    Objective:     BP 124/72   Pulse 63   Temp 98.2 F (36.8 C) (Temporal)   Ht '5\' 6"'$  (1.676 m)   Wt (!) 312 lb 6 oz (141.7 kg)   SpO2 98%   BMI 50.42 kg/m    Physical Exam Vitals reviewed.  Constitutional:      General: She is not in acute distress.    Appearance: Normal appearance.  HENT:     Head: Normocephalic and atraumatic.  Neck:     Vascular: No carotid bruit.  Cardiovascular:     Rate and Rhythm: Normal rate and regular rhythm.     Pulses: Normal pulses.     Heart sounds: Normal heart sounds.  Pulmonary:     Effort: Pulmonary effort is normal.     Breath sounds: Normal breath sounds.  Skin:    General: Skin is warm and dry.          Comments: Continued hyperpigmentation and swelling to right lower extremity. There is a well circumscribed mass/lump located at are of red dot on diagram. Not moveable.   Neurological:     General:  No focal deficit present.     Mental Status: She is alert and oriented to person, place, and time.  Psychiatric:        Mood and Affect: Mood normal.        Behavior: Behavior normal.        Judgment: Judgment normal.      No results found for any visits on 03/22/22.    The 10-year ASCVD risk score (Arnett DK, et al., 2019) is: 7.7%    Assessment & Plan:   Problem List Items Addressed This Visit       Other   Pain and swelling of lower extremity, right - Primary    Chronic, most likely multifactorial including lumbar radiculopathy, lymphedema, and venous insufficiency. I am not sure of etiology of the mass, the area does seem consistent with thickened skin, but will order ultrasound for further evaluation of this area. For now patient to continue using  compression stockings, tylenol and gabapentin. I do not think her presentation is consistent with erythema nodosum, but will refer to dermatology for further evaluation of skin as well. Discussed referral to PT for treatment of lymphedema but per patient preference will hold off on referral of that for now.       Relevant Orders   US SOFT TISSUE LOWER EXTREMITY LIMITED RIGHT (NON-VASCULAR)   Unspecified skin changes    See plan under problem "pain and swelling of lower extremity"      Relevant Orders   Ambulatory referral to Dermatology   Mass of right lower extremity    See plan under problem "pain and swelling of lower extremity"      Relevant Orders   US SOFT TISSUE LOWER EXTREMITY LIMITED RIGHT (NON-VASCULAR)    Return in about 3 months (around 06/20/2022). Total time spent on encounter today was 34 minutes which included face to face evaluation, chart review, and development/discussion of treatment plan   Ailene Ards, NP

## 2022-03-22 NOTE — Progress Notes (Signed)
Lumbar spine MRI shows some pinched nerves that could cause your legs to feel bad become painful or numb or tingly. The next step would be an injection in your back.  Recommend return to clinic to talk about your MRI and the back injection.  If you would like I can order the back injection right now to get that done before you even come back and see me.  Please let me know.

## 2022-03-22 NOTE — Assessment & Plan Note (Signed)
Chronic, most likely multifactorial including lumbar radiculopathy, lymphedema, and venous insufficiency. I am not sure of etiology of the mass, the area does seem consistent with thickened skin, but will order ultrasound for further evaluation of this area. For now patient to continue using compression stockings, tylenol and gabapentin. I do not think her presentation is consistent with erythema nodosum, but will refer to dermatology for further evaluation of skin as well. Discussed referral to PT for treatment of lymphedema but per patient preference will hold off on referral of that for now.

## 2022-03-30 NOTE — Progress Notes (Unsigned)
   Shirlyn Goltz, PhD, LAT, ATC acting as a scribe for Lynne Leader, MD.  Kaylee Carpenter is a 67 y.o. female who presents to [place] [facility] today for f/u lumbar radiculopathy w/ L-spine MRI review. Pt was last seen by Dr. Georgina Snell on 01/31/22 and was prescribed gabapentin and a l-spine MRI was ordered. Today, pt reports ***  Dx testing: 03/18/22 L-spine MRI 01/31/22 L-spine XR 08/05/21 LE vasc US             08/04/20 Labs             04/28/20 LE vasc US reflux  Pertinent review of systems: ***  Relevant historical information: ***   Exam:  There were no vitals taken for this visit. General: Well Developed, well nourished, and in no acute distress.   MSK: ***    Lab and Radiology Results No results found for this or any previous visit (from the past 72 hour(s)). No results found.     Assessment and Plan: 67 y.o. female with ***   PDMP not reviewed this encounter. No orders of the defined types were placed in this encounter.  No orders of the defined types were placed in this encounter.    Discussed warning signs or symptoms. Please see discharge instructions. Patient expresses understanding.   ***

## 2022-04-02 ENCOUNTER — Ambulatory Visit: Payer: Commercial Managed Care - PPO | Admitting: Family Medicine

## 2022-04-02 ENCOUNTER — Encounter: Payer: Self-pay | Admitting: Family Medicine

## 2022-04-02 VITALS — BP 122/84 | HR 67 | Ht 66.0 in | Wt 310.8 lb

## 2022-04-02 DIAGNOSIS — M5441 Lumbago with sciatica, right side: Secondary | ICD-10-CM

## 2022-04-02 DIAGNOSIS — M47816 Spondylosis without myelopathy or radiculopathy, lumbar region: Secondary | ICD-10-CM

## 2022-04-02 DIAGNOSIS — G8929 Other chronic pain: Secondary | ICD-10-CM

## 2022-04-02 DIAGNOSIS — M5442 Lumbago with sciatica, left side: Secondary | ICD-10-CM | POA: Diagnosis not present

## 2022-04-02 NOTE — Patient Instructions (Addendum)
Thank you for coming in today.   Please call [facility] at [phone] to schedule your spine injection.    Let me know how you feel about 1 week after the shot.   Keep me updated.

## 2022-04-10 LAB — HM MAMMOGRAPHY

## 2022-04-11 ENCOUNTER — Encounter: Payer: Self-pay | Admitting: Vascular Surgery

## 2022-04-11 ENCOUNTER — Ambulatory Visit (HOSPITAL_COMMUNITY)
Admission: RE | Admit: 2022-04-11 | Discharge: 2022-04-11 | Disposition: A | Discharge status: Home / Self Care | Payer: Commercial Managed Care - PPO | Source: Ambulatory Visit | Attending: Vascular Surgery | Admitting: Vascular Surgery

## 2022-04-11 ENCOUNTER — Ambulatory Visit (INDEPENDENT_AMBULATORY_CARE_PROVIDER_SITE_OTHER): Payer: Commercial Managed Care - PPO | Admitting: Vascular Surgery

## 2022-04-11 VITALS — BP 112/77 | HR 68 | Temp 98.0°F | Resp 4 | Ht 66.0 in | Wt 304.0 lb

## 2022-04-11 DIAGNOSIS — I878 Other specified disorders of veins: Secondary | ICD-10-CM | POA: Diagnosis present

## 2022-04-11 DIAGNOSIS — I872 Venous insufficiency (chronic) (peripheral): Secondary | ICD-10-CM

## 2022-04-11 DIAGNOSIS — R52 Pain, unspecified: Secondary | ICD-10-CM | POA: Diagnosis present

## 2022-04-11 NOTE — Progress Notes (Signed)
REASON FOR VISIT:   Follow-up of chronic venous insufficiency  MEDICAL ISSUES:   CHRONIC VENOUS INSUFFICIENCY: This patient has chronic venous insufficiency with CEAP C4 venous disease (hyperpigmentation).  She does not have any significant superficial venous reflux.  Thus she is not a candidate for laser ablation.  She does have evidence of deep venous reflux.  We have discussed conservative measures.  I have encouraged her to avoid prolonged sitting and standing.  She sits for long periods of time at work and I encouraged her to get up every hour and walk.  We discussed the importance of exercise pacifically walking and water aerobics.  I encouraged her to wear her compression stockings.  We discussed importance of leg elevation and the proper positioning for this.  We also discussed the importance of maintaining a healthy weight as central obesity especially increases lower extremity venous pressure.  I will be happy to see her back at any time if her symptoms progress.  HPI:   Kaylee Carpenter is a pleasant 67 y.o. female who I last saw on 04/28/2020 with chronic venous insufficiency.  She had CEAP C4 venous disease (hyperpigmentation).  At that time, on the right side there was no significant deep venous reflux or superficial venous reflux.  On the left side she had some deep venous reflux involving the common femoral vein but no significant superficial venous reflux.  We discussed conservative measures and I hope plan on seeing her as needed.  Since I saw her last she does complain of some swelling in her legs.  She has not had significant aching pain.  She does have some pain in her knees likely related to arthritis.  She does elevate her legs some at night.  She wears compression stockings some but does not like to wear them once the weather gets warmer.  She has had no previous history of DVT and has had no previous venous procedures.  Past Medical History:  Diagnosis Date    Hypertension    Hyperthyroidism 2007   Graves Disease   OSA on CPAP    PVD (peripheral vascular disease) (HCC)    Shortness of breath    due to edema and weight   Sleep apnea    uses a CPAP    Family History  Problem Relation Age of Onset   Hypertension Mother    Colon cancer Father     SOCIAL HISTORY: Social History   Tobacco Use   Smoking status: Never   Smokeless tobacco: Never  Substance Use Topics   Alcohol use: No    Allergies  Allergen Reactions   Latex Other (See Comments)    Top layer of skin came off    Current Outpatient Medications  Medication Sig Dispense Refill   acetaminophen (TYLENOL) 500 MG tablet Take 500-1,000 mg by mouth every 8 (eight) hours as needed.     aspirin 81 MG tablet Take 81 mg by mouth daily as needed.     atorvastatin (LIPITOR) 10 MG tablet Take 1 tablet (10 mg total) by mouth daily. 90 tablet 0   carvedilol (COREG) 12.5 MG tablet TAKE 1 TABLET(12.5 MG) BY MOUTH TWICE DAILY WITH A MEAL 180 tablet 1   cetirizine (ZYRTEC) 10 MG tablet Take 1 tablet by mouth daily.     cholecalciferol (VITAMIN D) 1000 units tablet Take 1,000 Units by mouth daily.     clotrimazole (LOTRIMIN) 1 % cream Apply 1 Application topically 2 (two) times daily. 30 g  0   fluticasone (FLONASE) 50 MCG/ACT nasal spray Place into the nose.     furosemide (LASIX) 40 MG tablet TAKE 1 TABLET(40 MG) BY MOUTH DAILY 90 tablet 1   gabapentin (NEURONTIN) 100 MG capsule Take 1-3 capsules (100-300 mg total) by mouth at bedtime as needed (nerve pain). 90 capsule 3   ibuprofen (ADVIL) 400 MG tablet Take 400 mg by mouth every 8 (eight) hours as needed.     LORazepam (ATIVAN) 0.5 MG tablet 1-2 tabs 30 - 60 min prior to MRI. Do not drive with this medicine. 4 tablet 0   spironolactone (ALDACTONE) 25 MG tablet TAKE 1 TABLET(25 MG) BY MOUTH DAILY 90 tablet 1   triamcinolone cream (KENALOG) 0.1 % Apply 1 application topically 2 (two) times daily as needed. legs 30 g 2   No current  facility-administered medications for this visit.    REVIEW OF SYSTEMS:  [X]  denotes positive finding, [ ]  denotes negative finding Cardiac  Comments:  Chest pain or chest pressure:    Shortness of breath upon exertion:    Short of breath when lying flat:    Irregular heart rhythm:        Vascular    Pain in calf, thigh, or hip brought on by ambulation:    Pain in feet at night that wakes you up from your sleep:     Blood clot in your veins:    Leg swelling:  x       Pulmonary    Oxygen at home:    Productive cough:     Wheezing:         Neurologic    Sudden weakness in arms or legs:     Sudden numbness in arms or legs:     Sudden onset of difficulty speaking or slurred speech:    Temporary loss of vision in one eye:     Problems with dizziness:         Gastrointestinal    Blood in stool:     Vomited blood:         Genitourinary    Burning when urinating:     Blood in urine:        Psychiatric    Major depression:         Hematologic    Bleeding problems:    Problems with blood clotting too easily:        Skin    Rashes or ulcers:        Constitutional    Fever or chills:     PHYSICAL EXAM:   Vitals:   04/11/22 1552  BP: 112/77  Pulse: 68  Resp: (!) 4  Temp: 98 F (36.7 C)  TempSrc: Temporal  SpO2: 96%  Weight: (!) 304 lb (137.9 kg)  Height: 5\' 6"  (1.676 m)    GENERAL: The patient is a well-nourished female, in no acute distress. The vital signs are documented above. CARDIAC: There is a regular rate and rhythm.  VASCULAR: I do not detect carotid bruits. She has a biphasic dorsalis pedis signal bilaterally. I had a hard time getting posterior tibial signals because of her ankle swelling. She has hyperpigmentation bilaterally consistent with chronic venous insufficiency. PULMONARY: There is good air exchange bilaterally without wheezing or rales. ABDOMEN: Soft and non-tender with normal pitched bowel sounds.  MUSCULOSKELETAL: There are no major  deformities or cyanosis. NEUROLOGIC: No focal weakness or paresthesias are detected. SKIN: There are no ulcers or rashes noted. PSYCHIATRIC: The patient  has a normal affect.  DATA:    VENOUS DUPLEX: I have independently interpreted her venous duplex scan today.  This was of the right lower extremity only.  There was no evidence of DVT.  There was no deep venous reflux.  There was reflux at the saphenofemoral junction only with no other superficial venous reflux.  Deitra Mayo [facility] [phone]

## 2022-04-18 ENCOUNTER — Ambulatory Visit
Admission: RE | Admit: 2022-04-18 | Discharge: 2022-04-18 | Disposition: A | Discharge status: Home / Self Care | Payer: Medicare Other | Source: Ambulatory Visit | Attending: Family Medicine | Admitting: Family Medicine

## 2022-04-18 DIAGNOSIS — M47816 Spondylosis without myelopathy or radiculopathy, lumbar region: Secondary | ICD-10-CM

## 2022-04-18 DIAGNOSIS — G8929 Other chronic pain: Secondary | ICD-10-CM

## 2022-04-18 MED ORDER — METHYLPREDNISOLONE ACETATE 40 MG/ML INJ SUSP (RADIOLOG
80.0000 mg | Freq: Once | INTRAMUSCULAR | Status: AC
  Administered 2022-04-18: 80 mg via INTRA_ARTICULAR

## 2022-04-18 MED ORDER — IOPAMIDOL (ISOVUE-M 200) INJECTION 41%
1.0000 mL | Freq: Once | INTRAMUSCULAR | Status: AC
  Administered 2022-04-18: 1 mL via INTRA_ARTICULAR

## 2022-04-18 NOTE — Discharge Instructions (Signed)

## 2022-04-20 ENCOUNTER — Ambulatory Visit
Admission: RE | Admit: 2022-04-20 | Discharge: 2022-04-20 | Disposition: A | Discharge status: Home / Self Care | Payer: Medicare Other | Source: Ambulatory Visit | Attending: Nurse Practitioner | Admitting: Nurse Practitioner

## 2022-04-20 DIAGNOSIS — M79604 Pain in right leg: Secondary | ICD-10-CM

## 2022-04-20 DIAGNOSIS — R2241 Localized swelling, mass and lump, right lower limb: Secondary | ICD-10-CM

## 2022-04-30 LAB — HM DEXA SCAN

## 2022-06-04 ENCOUNTER — Encounter: Payer: Self-pay | Admitting: Oncology

## 2022-06-16 ENCOUNTER — Other Ambulatory Visit: Payer: Self-pay | Admitting: Internal Medicine

## 2022-06-16 DIAGNOSIS — I10 Essential (primary) hypertension: Secondary | ICD-10-CM

## 2022-06-19 ENCOUNTER — Other Ambulatory Visit: Payer: Self-pay | Admitting: Nurse Practitioner

## 2022-06-19 DIAGNOSIS — I10 Essential (primary) hypertension: Secondary | ICD-10-CM

## 2022-06-22 ENCOUNTER — Ambulatory Visit (INDEPENDENT_AMBULATORY_CARE_PROVIDER_SITE_OTHER): Payer: Commercial Managed Care - PPO | Admitting: *Deleted

## 2022-06-22 ENCOUNTER — Encounter: Payer: Self-pay | Admitting: *Deleted

## 2022-06-22 VITALS — Ht 66.0 in | Wt 304.0 lb

## 2022-06-22 DIAGNOSIS — Z0000 Encounter for general adult medical examination without abnormal findings: Secondary | ICD-10-CM

## 2022-06-22 DIAGNOSIS — Z1211 Encounter for screening for malignant neoplasm of colon: Secondary | ICD-10-CM | POA: Diagnosis not present

## 2022-06-22 NOTE — Progress Notes (Signed)
Subjective:   Kaylee Carpenter is a 67 y.o. female who presents for an Initial Medicare Annual Wellness Visit. I connected with  Sharren Bridge on 06/22/22 by a audio enabled telemedicine application and verified that I am speaking with the correct person using two identifiers.  Patient Location: Home  Provider Location: Office/Clinic  I discussed the limitations of evaluation and management by telemedicine. The patient expressed understanding and agreed to proceed.   Review of Systems    Defer to PCP   Cardiac Risk Factors include: advanced age (>37men, >62 women);hypertension;sedentary lifestyle;obesity (BMI >30kg/m2)    Please see problem list for additional risk factors Objective:    Today's Vitals   06/22/22 0905  Weight: (!) 304 lb (137.9 kg)  Height: 5\' 6"  (1.676 m)   Body mass index is 49.07 kg/m.     06/22/2022    9:08 AM 03/22/2016    7:23 AM 03/21/2016   11:03 AM 02/24/2016    8:06 AM 08/23/2014   11:43 AM 09/15/2013    9:02 AM  Advanced Directives  Does Patient Have a Medical Advance Directive? No No No No No No  Would patient like information on creating a medical advance directive? No - Patient declined  Yes (MAU/Ambulatory/Procedural Areas - Information given)   No - patient declined information    Current Medications (verified) Outpatient Encounter Medications as of 06/22/2022  Medication Sig   acetaminophen (TYLENOL) 500 MG tablet Take 500-1,000 mg by mouth every 8 (eight) hours as needed.   aspirin 81 MG tablet Take 81 mg by mouth daily as needed.   carvedilol (COREG) 12.5 MG tablet Take 1 tablet (12.5 mg total) by mouth 2 (two) times daily with a meal. Annual appt due in Aug must see provider for future refills   furosemide (LASIX) 40 MG tablet Take 1 tablet (40 mg total) by mouth daily. Annual appt due in Aug must see provider for future refills   spironolactone (ALDACTONE) 25 MG tablet Take 1 tablet (25 mg total) by mouth daily. Follow-up appt due  in May must see provider for future refills   atorvastatin (LIPITOR) 10 MG tablet Take 1 tablet (10 mg total) by mouth daily. (Patient not taking: Reported on 06/22/2022)   cetirizine (ZYRTEC) 10 MG tablet Take 1 tablet by mouth daily. (Patient not taking: Reported on 06/22/2022)   cholecalciferol (VITAMIN D) 1000 units tablet Take 1,000 Units by mouth daily. (Patient not taking: Reported on 06/22/2022)   clotrimazole (LOTRIMIN) 1 % cream Apply 1 Application topically 2 (two) times daily. (Patient not taking: Reported on 06/22/2022)   fluticasone (FLONASE) 50 MCG/ACT nasal spray Place into the nose.   gabapentin (NEURONTIN) 100 MG capsule Take 1-3 capsules (100-300 mg total) by mouth at bedtime as needed (nerve pain). (Patient not taking: Reported on 06/22/2022)   ibuprofen (ADVIL) 400 MG tablet Take 400 mg by mouth every 8 (eight) hours as needed. (Patient not taking: Reported on 06/22/2022)   LORazepam (ATIVAN) 0.5 MG tablet 1-2 tabs 30 - 60 min prior to MRI. Do not drive with this medicine. (Patient not taking: Reported on 06/22/2022)   triamcinolone cream (KENALOG) 0.1 % Apply 1 application topically 2 (two) times daily as needed. legs (Patient not taking: Reported on 06/22/2022)   No facility-administered encounter medications on file as of 06/22/2022.    Allergies (verified) Latex   History: Past Medical History:  Diagnosis Date   Hypertension    Hyperthyroidism 2007   Graves Disease   OSA  on CPAP    PVD (peripheral vascular disease) (HCC)    Shortness of breath    due to edema and weight   Sleep apnea    uses a CPAP   Past Surgical History:  Procedure Laterality Date   CHOLECYSTECTOMY  1985   COLONOSCOPY WITH PROPOFOL N/A 02/24/2016   Procedure: COLONOSCOPY WITH PROPOFOL;  Surgeon: Jeani Hawking, MD;  Location: [facility];  Service: Endoscopy;  Laterality: N/A;   DILATATION & CURETTAGE/HYSTEROSCOPY WITH MYOSURE N/A 03/22/2016   Procedure: DILATATION & CURETTAGE/HYSTEROSCOPY WITH  MYOSURE;  Surgeon: Maxie Better, MD;  Location: [facility];  Service: Gynecology;  Laterality: N/A;   DILATATION & CURRETTAGE/HYSTEROSCOPY WITH RESECTOCOPE N/A 09/17/2013   Procedure: DILATATION & CURETTAGE, HYSTEROSCOPY, CYSTOSCOPY;  Surgeon: Purcell Nails, MD;  Location: [facility];  Service: Gynecology;  Laterality: N/A;   DILATION AND CURETTAGE OF UTERUS     Family History  Problem Relation Age of Onset   Hypertension Mother    Colon cancer Father    Social History   Socioeconomic History   Marital status: Divorced    Spouse name: Not on file   Number of children: 2   Years of education: BS   Highest education level: Not on file  Occupational History   Not on file  Tobacco Use   Smoking status: Never   Smokeless tobacco: Never  Vaping Use   Vaping Use: Never used  Substance and Sexual Activity   Alcohol use: No   Drug use: No   Sexual activity: Not on file  Other Topics Concern   Not on file  Social History Narrative   Drinks about 1-2 sodas a week.    Social Determinants of Health   Financial Resource Strain: Low Risk  (06/22/2022)   Overall Financial Resource Strain (CARDIA)    Difficulty of Paying Living Expenses: Not hard at all  Food Insecurity: No Food Insecurity (06/22/2022)   Hunger Vital Sign    Worried About Running Out of Food in the Last Year: Never true    Ran Out of Food in the Last Year: Never true  Transportation Needs: No Transportation Needs (06/22/2022)   PRAPARE - Administrator, Civil Service (Medical): No    Lack of Transportation (Non-Medical): No  Physical Activity: Inactive (06/22/2022)   Exercise Vital Sign    Days of Exercise per Week: 0 days    Minutes of Exercise per Session: 0 min  Stress: No Stress Concern Present (06/22/2022)   [employer] of Occupational Health - Occupational Stress Questionnaire    Feeling of Stress : Not at all  Social Connections: Moderately Isolated (06/22/2022)   Social Connection and  Isolation Panel [NHANES]    Frequency of Communication with Friends and Family: Never    Frequency of Social Gatherings with Friends and Family: More than three times a week    Attends Religious Services: More than 4 times per year    Active Member of [employer] or Organizations: No    Attends Engineer, structural: Never    Marital Status: Divorced    Tobacco Counseling Counseling given: Not Answered   Clinical Intake:  Pre-visit preparation completed: Yes  Pain : No/denies pain     Nutritional Status: BMI > 30  Obese Nutritional Risks: None Diabetes: No  How often do you need to have someone help you when you read instructions, pamphlets, or other written materials from your doctor or pharmacy?: 1 - Never What is the last grade level  you completed in school?: 4 years college  Diabetic?no  Interpreter Needed?: No      Activities of Daily Living    06/22/2022    9:23 AM 06/22/2022    9:13 AM  In your present state of health, do you have any difficulty performing the following activities:  Hearing?  0  Vision?  1  Difficulty concentrating or making decisions?  0  Walking or climbing stairs?  1  Dressing or bathing?  0  Doing errands, shopping?  0  Preparing Food and eating ? N   Using the Toilet? N   In the past six months, have you accidently leaked urine? N   Do you have problems with loss of bowel control? N   Managing your Medications? N   Managing your Finances? N   Housekeeping or managing your Housekeeping? N     Patient Care Team: Elenore Paddy, NP as PCP - General (Nurse Practitioner)  Indicate any recent Medical Services you may have received from other than [facility] providers in the past year (date may be approximate).     Assessment:   This is a routine wellness examination for Annalyce.  Hearing/Vision screen Vision Screening - Comments:: Annual eye exam   Dietary issues and exercise activities discussed: Current Exercise Habits: The  patient does not participate in regular exercise at present, Exercise limited by: None identified   Goals Addressed   None   Depression Screen    06/22/2022    9:12 AM 06/22/2022    9:08 AM 03/22/2022    4:38 PM 01/25/2022    8:58 AM 08/25/2021   11:11 AM 04/07/2021    3:46 PM 03/17/2021    2:22 PM  PHQ 2/9 Scores  PHQ - 2 Score 0 0 0 0 0 0 0  PHQ- 9 Score   0 0 0      Fall Risk    06/22/2022    9:08 AM 03/22/2022    4:38 PM 01/25/2022    8:58 AM 08/25/2021   11:11 AM 04/07/2021    3:46 PM  Fall Risk   Falls in the past year? 1 0 0 1 0  Number falls in past yr: 1 0 0 0   Injury with Fall? 0 0 0 0   Risk for fall due to : Impaired balance/gait No Fall Risks     Follow up Falls evaluation completed Falls evaluation completed Falls evaluation completed      FALL RISK PREVENTION PERTAINING TO THE HOME:  Any stairs in or around the home? No  If so, are there any without handrails? No  Home free of loose throw rugs in walkways, pet beds, electrical cords, etc? Yes  Adequate lighting in your home to reduce risk of falls? Yes   ASSISTIVE DEVICES UTILIZED TO PREVENT FALLS:  Life alert? No  Use of a cane, walker or w/c? No  Grab bars in the bathroom? No  Shower chair or bench in shower? No  Elevated toilet seat or a handicapped toilet? No   TIMED UP AND GO:  Was the test performed? No .  Length of time to ambulate 10 feet: n/a sec.     Cognitive Function:        06/22/2022    9:18 AM  6CIT Screen  What Year? 0 points  What month? 0 points  What time? 0 points  Count back from 20 0 points  Months in reverse 0 points  Repeat phrase 0 points  Total Score 0 points    Immunizations Immunization History  Administered Date(s) Administered   Fluad Quad(high Dose 65+) 04/07/2021   Influenza-Unspecified 01/04/2022   PNEUMOCOCCAL CONJUGATE-20 08/25/2021    TDAP status: Due, Education has been provided regarding the importance of this vaccine. Advised may receive this  vaccine at local pharmacy or Health Dept. Aware to provide a copy of the vaccination record if obtained from local pharmacy or Health Dept. Verbalized acceptance and understanding.  Flu Vaccine status: Up to date  Pneumococcal vaccine status: Up to date  Covid-19 vaccine status: Completed vaccines  Qualifies for Shingles Vaccine? Yes   Zostavax completed No   Shingrix Completed?: No.    Education has been provided regarding the importance of this vaccine. Patient has been advised to call insurance company to determine out of pocket expense if they have not yet received this vaccine. Advised may also receive vaccine at local pharmacy or Health Dept. Verbalized acceptance and understanding.  Screening Tests Health Maintenance  Topic Date Due   Colonoscopy  02/23/2021   Zoster Vaccines- Shingrix (1 of 2) 02/22/2023 (Originally 09/06/1974)   INFLUENZA VACCINE  08/23/2022   Medicare Annual Wellness (AWV)  06/22/2023   MAMMOGRAM  04/09/2024   Pneumonia Vaccine 88+ Years old  Completed   DEXA SCAN  Completed   Hepatitis C Screening  Completed   HPV VACCINES  Aged Out   DTaP/Tdap/Td  Discontinued   COVID-19 Vaccine  Discontinued    Health Maintenance  Health Maintenance Due  Topic Date Due   Colonoscopy  02/23/2021    Colorectal cancer screening: Referral to GI placed 06/22/2022. Pt aware the office will call re: appt.  Mammogram status: Completed 04/10/2022. Repeat every year  Bone Density status: Completed 05/14/2022. Results reflect: Bone density results: OSTEOPENIA. Repeat every 2 years.  Lung Cancer Screening: (Low Dose CT Chest recommended if Age 5-80 years, 30 pack-year currently smoking OR have quit w/in 15years.) does not qualify.   Lung Cancer Screening Referral: n/a  Additional Screening:  Hepatitis C Screening: does qualify; Completed 05/25/2021  Vision Screening: Recommended annual ophthalmology exams for early detection of glaucoma and other disorders of the  eye. Is the patient up to date with their annual eye exam?  Yes  Who is the provider or what is the name of the office in which the patient attends annual eye exams? Patient is looking for a new eye doctor If pt is not established with a provider, would they like to be referred to a provider to establish care? No .   Dental Screening: Recommended annual dental exams for proper oral hygiene  Community Resource Referral / Chronic Care Management: CRR required this visit?  No   CCM required this visit?  No      Plan:     I have personally reviewed and noted the following in the patient's chart:   Medical and social history Use of alcohol, tobacco or illicit drugs  Current medications and supplements including opioid prescriptions. Patient is not currently taking opioid prescriptions. Functional ability and status Nutritional status Physical activity Advanced directives List of other physicians Hospitalizations, surgeries, and ER visits in previous 12 months Vitals Screenings to include cognitive, depression, and falls Referrals and appointments  In addition, I have reviewed and discussed with patient certain preventive protocols, quality metrics, and best practice recommendations. A written personalized care plan for preventive services as well as general preventive health recommendations were provided to patient.     Tamela Oddi, CMA  06/22/2022  Non face to face 30 minutes  Nurse Notes:  Ms. Craddock , Thank you for taking time to come for your Medicare Wellness Visit. I appreciate your ongoing commitment to your health goals. Please review the following plan we discussed and let me know if I can assist you in the future.   These are the goals we discussed:  Goals   None     This is a list of the screening recommended for you and due dates:  Health Maintenance  Topic Date Due   Colon Cancer Screening  02/23/2021   Zoster (Shingles) Vaccine (1 of 2)  02/22/2023*   Flu Shot  08/23/2022   Medicare Annual Wellness Visit  06/22/2023   Mammogram  04/09/2024   Pneumonia Vaccine  Completed   DEXA scan (bone density measurement)  Completed   Hepatitis C Screening  Completed   HPV Vaccine  Aged Out   DTaP/Tdap/Td vaccine  Discontinued   COVID-19 Vaccine  Discontinued  *Topic was postponed. The date shown is not the original due date.

## 2022-06-22 NOTE — Patient Instructions (Signed)

## 2022-06-29 ENCOUNTER — Ambulatory Visit: Payer: Commercial Managed Care - PPO | Admitting: Nurse Practitioner

## 2022-06-29 ENCOUNTER — Encounter: Payer: Self-pay | Admitting: Nurse Practitioner

## 2022-06-29 VITALS — BP 140/80 | HR 64 | Temp 97.9°F | Ht 66.0 in | Wt 316.0 lb

## 2022-06-29 DIAGNOSIS — Z6841 Body Mass Index (BMI) 40.0 and over, adult: Secondary | ICD-10-CM

## 2022-06-29 DIAGNOSIS — R9389 Abnormal findings on diagnostic imaging of other specified body structures: Secondary | ICD-10-CM | POA: Diagnosis not present

## 2022-06-29 DIAGNOSIS — I10 Essential (primary) hypertension: Secondary | ICD-10-CM

## 2022-06-29 DIAGNOSIS — R7303 Prediabetes: Secondary | ICD-10-CM | POA: Insufficient documentation

## 2022-06-29 DIAGNOSIS — E785 Hyperlipidemia, unspecified: Secondary | ICD-10-CM | POA: Diagnosis not present

## 2022-06-29 DIAGNOSIS — E559 Vitamin D deficiency, unspecified: Secondary | ICD-10-CM

## 2022-06-29 MED ORDER — CARVEDILOL 12.5 MG PO TABS: 12.5000 mg | ORAL_TABLET | Freq: Two times a day (BID) | ORAL | 3 refills | Status: DC

## 2022-06-29 MED ORDER — FUROSEMIDE 40 MG PO TABS: 40.0000 mg | ORAL_TABLET | Freq: Every day | ORAL | 3 refills | Status: DC

## 2022-06-29 MED ORDER — SPIRONOLACTONE 25 MG PO TABS: 25.0000 mg | ORAL_TABLET | Freq: Every day | ORAL | 0 refills | Status: DC

## 2022-06-29 NOTE — Assessment & Plan Note (Signed)
Chronic Last A1C 6.4 Labs ordered, further recommendations may be made based upon his results

## 2022-06-29 NOTE — Progress Notes (Signed)
Established Patient Office Visit  Subjective   Patient ID: Kaylee Carpenter, female    DOB: 1955-10-12  Age: 67 y.o. MRN: [id number]  Chief Complaint  Patient presents with   Follow-up    Pt states that she needed a medication refill    Hypertension/obesity/prediabetes/hyperlipidemia: Continues on asa 81mg /day, atorvastatin 10mg /day, carvedilol 12.5mg  BID, furosemide 40mg /day, and spironolactone 25mg /day. Last LDL from 05/2021 75. Last A1C from 05/2021 6.4. Weight has been increasing, up 12# since last weight was checked. Tolerating medications well, requesting refills today.   Thickened endometrium: Has known history of this and was being treated by Dr. Cherly Hensen, but patient reports she has had some scheduling difficulties with that office and would like to be referred to a different OBGYN to take over evaluation and treatment. Per chart review appears she may have had endometrial biopsy in 2018 which was negative for malignancy. Pelvic u/s from 02/2022 identified endometrial thickness of 12mm. Denies vaginal bleeding at this time.   Vitamin D deficiency/osteopenia: on vitamin D supplement of 1000IUS daily, due to have serum level checked.      Review of Systems  Constitutional:  Negative for malaise/fatigue.  Respiratory:  Negative for cough, shortness of breath and wheezing.   Cardiovascular:  Negative for chest pain.      Objective:     BP (!) 140/80 (BP Location: Right Arm, Patient Position: Sitting, Cuff Size: Normal)   Pulse 64   Temp 97.9 F (36.6 C) (Oral)   Ht 5\' 6"  (1.676 m)   Wt (!) 316 lb (143.3 kg)   SpO2 96%   BMI 51.00 kg/m  BP Readings from Last 3 Encounters:  06/29/22 (!) 140/80  04/18/22 113/68  04/11/22 112/77   Wt Readings from Last 3 Encounters:  06/29/22 (!) 316 lb (143.3 kg)  06/22/22 (!) 304 lb (137.9 kg)  04/11/22 (!) 304 lb (137.9 kg)      Physical Exam Vitals reviewed.  Constitutional:      General: She is not in acute distress.     Appearance: Normal appearance.  HENT:     Head: Normocephalic and atraumatic.  Neck:     Vascular: No carotid bruit.  Cardiovascular:     Rate and Rhythm: Normal rate and regular rhythm.     Pulses: Normal pulses.     Heart sounds: Normal heart sounds.  Pulmonary:     Effort: Pulmonary effort is normal.     Breath sounds: Normal breath sounds.  Skin:    General: Skin is warm and dry.  Neurological:     General: No focal deficit present.     Mental Status: She is alert and oriented to person, place, and time.  Psychiatric:        Mood and Affect: Mood normal.        Behavior: Behavior normal.        Judgment: Judgment normal.      No results found for any visits on 06/29/22.    The 10-year ASCVD risk score (Arnett DK, et al., 2019) is: 9.9%    Assessment & Plan:   Problem List Items Addressed This Visit       Cardiovascular and Mediastinum   Hypertension    Chronic BP borderline above goal, will monitor closely. If still elevated at next follow-up may adjust medication regimen.  Continue carvedilol 12.5 mg BID, furosemide 40 mg/day, and spironolactone 25 mg/day Will check CMP at next lab draw, further recommendations may be made based on results.  Relevant Medications   carvedilol (COREG) 12.5 MG tablet   furosemide (LASIX) 40 MG tablet   spironolactone (ALDACTONE) 25 MG tablet   Other Relevant Orders   CBC   Comprehensive metabolic panel   Hemoglobin A1c   Lipid panel   TSH   Ambulatory referral to Obstetrics / Gynecology     Other   Obesity    Chronic Labs ordered, further recommendations may be made based upon his results       Relevant Medications   carvedilol (COREG) 12.5 MG tablet   furosemide (LASIX) 40 MG tablet   spironolactone (ALDACTONE) 25 MG tablet   Other Relevant Orders   CBC   Comprehensive metabolic panel   Hemoglobin A1c   Lipid panel   TSH   Ambulatory referral to Obstetrics / Gynecology   Hyperlipidemia     Chronic Patient will return to lab next week in fasting state for repeat lipid panel. Further recommendations will be made based on result.  For now continue asa 81 mg/day and atorvasatin 10 mg/day      Relevant Medications   carvedilol (COREG) 12.5 MG tablet   furosemide (LASIX) 40 MG tablet   spironolactone (ALDACTONE) 25 MG tablet   Other Relevant Orders   CBC   Comprehensive metabolic panel   Hemoglobin A1c   Lipid panel   TSH   Ambulatory referral to Obstetrics / Gynecology   Vitamin D deficiency    Chronic Serum vitamin D level and CMP ordered today. Further recommendations may be made based on results.       Relevant Orders   VITAMIN D 25 Hydroxy (Vit-D Deficiency, Fractures)   Thickened endometrium - Primary    Referral to [facility] made today      Relevant Medications   carvedilol (COREG) 12.5 MG tablet   furosemide (LASIX) 40 MG tablet   spironolactone (ALDACTONE) 25 MG tablet   Other Relevant Orders   CBC   Comprehensive metabolic panel   Hemoglobin A1c   Lipid panel   TSH   Ambulatory referral to Obstetrics / Gynecology   Prediabetes    Chronic Last A1C 6.4 Labs ordered, further recommendations may be made based upon his results        Relevant Medications   carvedilol (COREG) 12.5 MG tablet   furosemide (LASIX) 40 MG tablet   spironolactone (ALDACTONE) 25 MG tablet   Other Relevant Orders   CBC   Comprehensive metabolic panel   Hemoglobin A1c   Lipid panel   TSH   Ambulatory referral to Obstetrics / Gynecology    Return in about 3 months (around 09/29/2022) for F/U with Winslow Verrill.    Elenore Paddy, NP

## 2022-06-29 NOTE — Patient Instructions (Signed)
Come back to lab in fasting state -- no food for 8 hours before test, but you can drink water Lab hours Mon-Fri 8am-4:30pm

## 2022-06-29 NOTE — Assessment & Plan Note (Signed)
Referral to [facility] made today

## 2022-06-29 NOTE — Assessment & Plan Note (Signed)
Chronic BP borderline above goal, will monitor closely. If still elevated at next follow-up may adjust medication regimen.  Continue carvedilol 12.5 mg BID, furosemide 40 mg/day, and spironolactone 25 mg/day Will check CMP at next lab draw, further recommendations may be made based on results.

## 2022-06-29 NOTE — Assessment & Plan Note (Signed)
Chronic Labs ordered, further recommendations may be made based upon his results. 

## 2022-06-29 NOTE — Assessment & Plan Note (Signed)
Chronic Patient will return to lab next week in fasting state for repeat lipid panel. Further recommendations will be made based on result.  For now continue asa 81 mg/day and atorvasatin 10 mg/day

## 2022-06-29 NOTE — Assessment & Plan Note (Signed)
Chronic Serum vitamin D level and CMP ordered today. Further recommendations may be made based on results.

## 2022-07-16 ENCOUNTER — Other Ambulatory Visit: Payer: Self-pay | Admitting: Nurse Practitioner

## 2022-07-16 ENCOUNTER — Other Ambulatory Visit (INDEPENDENT_AMBULATORY_CARE_PROVIDER_SITE_OTHER): Payer: Commercial Managed Care - PPO

## 2022-07-16 DIAGNOSIS — E785 Hyperlipidemia, unspecified: Secondary | ICD-10-CM

## 2022-07-16 DIAGNOSIS — Z6841 Body Mass Index (BMI) 40.0 and over, adult: Secondary | ICD-10-CM

## 2022-07-16 DIAGNOSIS — R7303 Prediabetes: Secondary | ICD-10-CM

## 2022-07-16 DIAGNOSIS — R9389 Abnormal findings on diagnostic imaging of other specified body structures: Secondary | ICD-10-CM | POA: Diagnosis not present

## 2022-07-16 DIAGNOSIS — I10 Essential (primary) hypertension: Secondary | ICD-10-CM | POA: Diagnosis not present

## 2022-07-16 DIAGNOSIS — E559 Vitamin D deficiency, unspecified: Secondary | ICD-10-CM

## 2022-07-16 LAB — COMPREHENSIVE METABOLIC PANEL
ALT: 16 U/L (ref 0–35)
AST: 23 U/L (ref 0–37)
Albumin: 3.9 g/dL (ref 3.5–5.2)
Alkaline Phosphatase: 83 U/L (ref 39–117)
BUN: 16 mg/dL (ref 6–23)
CO2: 28 mEq/L (ref 19–32)
Calcium: 10.5 mg/dL (ref 8.4–10.5)
Chloride: 102 mEq/L (ref 96–112)
Creatinine, Ser: 0.96 mg/dL (ref 0.40–1.20)
GFR: 60.65 mL/min (ref >60.00)
Glucose, Bld: 128 mg/dL — ABNORMAL HIGH (ref 70–99)
Potassium: 3.7 mEq/L (ref 3.5–5.1)
Sodium: 138 mEq/L (ref 135–145)
Total Bilirubin: 0.5 mg/dL (ref 0.2–1.2)
Total Protein: 7.6 g/dL (ref 6.0–8.3)

## 2022-07-16 LAB — CBC
HCT: 38.9 % (ref 36.0–46.0)
Hemoglobin: 12.6 g/dL (ref 12.0–15.0)
MCHC: 32.3 g/dL (ref 30.0–36.0)
MCV: 86.6 fl (ref 78.0–100.0)
Platelets: 267 10*3/uL (ref 150.0–400.0)
RBC: 4.49 Mil/uL (ref 3.87–5.11)
RDW: 14.6 % (ref 11.5–15.5)
WBC: 5.1 10*3/uL (ref 4.0–10.5)

## 2022-07-16 LAB — LIPID PANEL
Cholesterol: 203 mg/dL — ABNORMAL HIGH (ref 0–200)
HDL: 46.9 mg/dL (ref >39.00)
LDL Cholesterol: 119 mg/dL — ABNORMAL HIGH (ref 0–99)
NonHDL: 156.49
Total CHOL/HDL Ratio: 4
Triglycerides: 188 mg/dL — ABNORMAL HIGH (ref 0.0–149.0)
VLDL: 37.6 mg/dL (ref 0.0–40.0)

## 2022-07-16 LAB — TSH: TSH: 1.54 u[IU]/mL (ref 0.35–5.50)

## 2022-07-16 LAB — VITAMIN D 25 HYDROXY (VIT D DEFICIENCY, FRACTURES): VITD: 16.94 ng/mL — ABNORMAL LOW (ref 30.00–100.00)

## 2022-07-16 LAB — HEMOGLOBIN A1C: Hgb A1c MFr Bld: 6.2 % (ref 4.6–6.5)

## 2022-07-18 ENCOUNTER — Other Ambulatory Visit: Payer: Self-pay

## 2022-07-19 ENCOUNTER — Telehealth: Payer: Self-pay | Admitting: Nurse Practitioner

## 2022-07-19 ENCOUNTER — Other Ambulatory Visit: Payer: Self-pay | Admitting: Nurse Practitioner

## 2022-07-19 DIAGNOSIS — E785 Hyperlipidemia, unspecified: Secondary | ICD-10-CM

## 2022-07-19 NOTE — Telephone Encounter (Addendum)
If patient has NOT been taking her atrovastatin 10mg  tablets, then she should just start by taking this dose. Take atorvastatin 10 mg tablet - 1 tablet by mouth every evening. We will need to recheck her cholesterol in fasting state once she has been on the atorvastatin for at least 1 month. Thus, she should come back to lab a week or so before her next appointment in a fasting state (no food for 8 hours prior to lab draw) so that we can recheck her labs to see if cholesterol improved with being on the atorvastatin. Please let me know if she has any questions.

## 2022-07-20 MED ORDER — ATORVASTATIN CALCIUM 10 MG PO TABS
10.0000 mg | ORAL_TABLET | Freq: Every day | ORAL | 0 refills | Status: DC
Start: 2022-07-20 — End: 2022-09-27

## 2022-07-20 NOTE — Telephone Encounter (Signed)
Called pt gave her Maralyn Sago response. Pt states she hasn't been taking because she was out of refills. Inform pt will send rx to [facility] and made lab appt for 09/27/22...Raechel Chute

## 2022-07-20 NOTE — Addendum Note (Signed)
Addended by: Deatra James on: 07/20/2022 11:14 AM   Modules accepted: Orders

## 2022-08-18 ENCOUNTER — Other Ambulatory Visit: Payer: Self-pay | Admitting: Nurse Practitioner

## 2022-08-18 DIAGNOSIS — E785 Hyperlipidemia, unspecified: Secondary | ICD-10-CM

## 2022-08-18 DIAGNOSIS — R7303 Prediabetes: Secondary | ICD-10-CM

## 2022-08-18 DIAGNOSIS — I10 Essential (primary) hypertension: Secondary | ICD-10-CM

## 2022-08-18 DIAGNOSIS — R9389 Abnormal findings on diagnostic imaging of other specified body structures: Secondary | ICD-10-CM

## 2022-08-28 ENCOUNTER — Encounter (HOSPITAL_BASED_OUTPATIENT_CLINIC_OR_DEPARTMENT_OTHER): Payer: Self-pay | Admitting: Obstetrics and Gynecology

## 2022-08-28 ENCOUNTER — Other Ambulatory Visit: Payer: Self-pay

## 2022-08-28 NOTE — Progress Notes (Addendum)
Spoke w/ via phone for pre-op interview---pt Lab needs dos----               Lab results------I-stat and EKG COVID test -----patient states asymptomatic no test needed Arrive at -------0530 NPO after MN NO Solid Food.  Clear liquids from MN until---0430 Med rec completed Medications to take morning of surgery -----none Diabetic medication -----n/a Patient instructed no nail polish to be worn day of surgery Patient instructed to bring photo id and insurance card day of surgery Patient aware to have Driver (ride ) / caregiver  daughter Buena Mckeller  for 24 hours after surgery  Patient Special Instructions ----- Pre-Op special Instructions -----pt needs to come in for labs and weight check  Patient verbalized understanding of instructions that were given at this phone interview. Patient denies shortness of breath, chest pain, fever, cough at this phone interview.   Pt reports when she lies flat she has nightmares so sleeps with her head elevated.

## 2022-08-29 NOTE — Progress Notes (Signed)
Pt called wanting her weight check and blood appt changed to 08/31/2022 for 830 am. Pt time changed to 0830 and pt called with new time.

## 2022-08-31 ENCOUNTER — Other Ambulatory Visit: Payer: Self-pay

## 2022-08-31 ENCOUNTER — Encounter (HOSPITAL_COMMUNITY)
Admission: RE | Admit: 2022-08-31 | Discharge: 2022-08-31 | Disposition: A | Discharge status: Home / Self Care | Payer: Commercial Managed Care - PPO | Source: Ambulatory Visit | Attending: Obstetrics and Gynecology | Admitting: Obstetrics and Gynecology

## 2022-08-31 DIAGNOSIS — R001 Bradycardia, unspecified: Secondary | ICD-10-CM | POA: Diagnosis not present

## 2022-08-31 DIAGNOSIS — Z01818 Encounter for other preprocedural examination: Secondary | ICD-10-CM | POA: Diagnosis present

## 2022-08-31 DIAGNOSIS — R9389 Abnormal findings on diagnostic imaging of other specified body structures: Secondary | ICD-10-CM | POA: Diagnosis not present

## 2022-08-31 LAB — CBC
HCT: 38.3 % (ref 36.0–46.0)
Hemoglobin: 11.9 g/dL — ABNORMAL LOW (ref 12.0–15.0)
MCH: 27.7 pg (ref 26.0–34.0)
MCHC: 31.1 g/dL (ref 30.0–36.0)
MCV: 89.1 fL (ref 80.0–100.0)
Platelets: 256 10*3/uL (ref 150–400)
RBC: 4.3 MIL/uL (ref 3.87–5.11)
RDW: 13.6 % (ref 11.5–15.5)
WBC: 5 10*3/uL (ref 4.0–10.5)
nRBC: 0 % (ref 0.0–0.2)

## 2022-08-31 LAB — TYPE AND SCREEN
ABO/RH(D): O POS
Antibody Screen: NEGATIVE

## 2022-09-05 NOTE — H&P (Signed)
Preop Preadmission History and Physical  Kaylee Carpenter is an 67 y.o. [id number] presenting for hysteroscopy, D&C, Mirena IUD placement for evaluation and treatment of persistent thickened EMS.   She was eferred to me by her PCP for f/u of thickened EMS 2014 had EMB by Dr. Su Hilt, pathology was normal. Was seen previously in 2018 by Dr. Cherly Hensen and had a hysteroscopy, D&C for thickened EMS. Pathology at that time was benign. Recent US 02/2022 with EMS 12mm. She reports EMB by Dr. Cherly Hensen in 04/2022 - normal pathology per patient report. She was recommended to have a hysteroscopy, D&C. She was having issues getting this surgery scheduled and was thus referred to me.  Pertinent Gynecological History: Menses: post-menopausal Bleeding: none, does have hx of PMB Contraception: none DES exposure: unknown Blood transfusions: none Sexually transmitted diseases: no past history Previous GYN Procedures: Hysteroscopy, D&C.  OB History: G4, P2022  No LMP recorded. Patient is postmenopausal.    Past Medical History:  Diagnosis Date   Arthritis    in her back had a shot in march 2024. 08/28/2022   Hypertension    Hyperthyroidism 2007   Graves Disease   OSA on CPAP    doesn't use CPAP anymore and doesn't have it . 08/28/2022   PVD (peripheral vascular disease) (HCC)    see Dr. Ashley Royalty   Shortness of breath    due to edema and weight   Sleep apnea    uses a CPAP   Wears dentures    upper 08/28/2022   Wears partial dentures    lower . 08/28/2022    Past Surgical History:  Procedure Laterality Date   CHOLECYSTECTOMY  1985   COLONOSCOPY WITH PROPOFOL N/A 02/24/2016   Procedure: COLONOSCOPY WITH PROPOFOL;  Surgeon: Jeani Hawking, MD;  Location: [facility];  Service: Endoscopy;  Laterality: N/A;   DILATATION & CURETTAGE/HYSTEROSCOPY WITH MYOSURE N/A 03/22/2016   Procedure: DILATATION & CURETTAGE/HYSTEROSCOPY WITH MYOSURE;  Surgeon: Maxie Better, MD;  Location: [facility];  Service:  Gynecology;  Laterality: N/A;   DILATATION & CURRETTAGE/HYSTEROSCOPY WITH RESECTOCOPE N/A 09/17/2013   Procedure: DILATATION & CURETTAGE, HYSTEROSCOPY, CYSTOSCOPY;  Surgeon: Purcell Nails, MD;  Location: [facility];  Service: Gynecology;  Laterality: N/A;   DILATION AND CURETTAGE OF UTERUS      Family History  Problem Relation Age of Onset   Hypertension Mother    Colon cancer Father     Social History:  reports that she has never smoked. She has never used smokeless tobacco. She reports that she does not drink alcohol and does not use drugs.  Allergies:  Allergies  Allergen Reactions   Latex Other (See Comments)    Top layer of skin came off    No medications prior to admission.       08/31/2022    8:30 AM 08/28/2022    3:06 PM 06/29/2022    4:28 PM  Vitals with BMI  Height 5\' 6"  5\' 6"    Weight 312 lbs 312 lbs   BMI 50.38 50.38   Systolic 137  140  Diastolic 87  80  Pulse 56    Vitals to be rechecked on admission - VSS in the office Height 5\' 6"  (1.676 m), weight (!) 141.5 kg.  Constitutional:      Appearance: Normal appearance.  HENT:     Head: Normocephalic.  Eyes:     Pupils: Pupils are equal, round Cardiovascular:     Rate and Rhythm: Normal rate    Pulses:  Normal pulses.  Abdominal:     General: Abdomen is nontender, nondistended Neurological:     Mental Status: She is alert.   Recent Results (from the past 2160 hour(s))  VITAMIN D 25 Hydroxy (Vit-D Deficiency, Fractures)     Status: Abnormal   Collection Time: 07/16/22  8:03 AM  Result Value Ref Range   VITD 16.94 (L) 30.00 - 100.00 ng/mL  TSH     Status: None   Collection Time: 07/16/22  8:03 AM  Result Value Ref Range   TSH 1.54 0.35 - 5.50 uIU/mL  Lipid panel     Status: Abnormal   Collection Time: 07/16/22  8:03 AM  Result Value Ref Range   Cholesterol 203 (H) 0 - 200 mg/dL    Comment: ATP III Classification       Desirable:  < 200 mg/dL               Borderline High:  200 - 239 mg/dL           High:  > = 161 mg/dL   Triglycerides 096.0 (H) 0.0 - 149.0 mg/dL    Comment: Normal:  <454 mg/dLBorderline High:  150 - 199 mg/dL   HDL 09.81 >19.14 mg/dL   VLDL 78.2 0.0 - 95.6 mg/dL   LDL Cholesterol 213 (H) 0 - 99 mg/dL   Total CHOL/HDL Ratio 4     Comment:                Men          Women1/2 Average Risk     3.4          3.3Average Risk          5.0          4.42X Average Risk          9.6          7.13X Average Risk          15.0          11.0                       NonHDL 156.49     Comment: NOTE:  Non-HDL goal should be 30 mg/dL higher than patient's LDL goal (i.e. LDL goal of < 70 mg/dL, would have non-HDL goal of < 100 mg/dL)  Hemoglobin Y8M     Status: None   Collection Time: 07/16/22  8:03 AM  Result Value Ref Range   Hgb A1c MFr Bld 6.2 4.6 - 6.5 %    Comment: Glycemic Control Guidelines for People with Diabetes:Non Diabetic:  <6%Goal of Therapy: <7%Additional Action Suggested:  >8%   Comprehensive metabolic panel     Status: Abnormal   Collection Time: 07/16/22  8:03 AM  Result Value Ref Range   Sodium 138 135 - 145 mEq/L   Potassium 3.7 3.5 - 5.1 mEq/L   Chloride 102 96 - 112 mEq/L   CO2 28 19 - 32 mEq/L   Glucose, Bld 128 (H) 70 - 99 mg/dL   BUN 16 6 - 23 mg/dL   Creatinine, Ser 5.78 0.40 - 1.20 mg/dL   Total Bilirubin 0.5 0.2 - 1.2 mg/dL   Alkaline Phosphatase 83 39 - 117 U/L   AST 23 0 - 37 U/L   ALT 16 0 - 35 U/L   Total Protein 7.6 6.0 - 8.3 g/dL   Albumin 3.9 3.5 - 5.2 g/dL   GFR 46.96 >29.52 mL/min  Comment: Calculated using the CKD-EPI Creatinine Equation (2021)   Calcium 10.5 8.4 - 10.5 mg/dL  CBC     Status: None   Collection Time: 07/16/22  8:03 AM  Result Value Ref Range   WBC 5.1 4.0 - 10.5 K/uL   RBC 4.49 3.87 - 5.11 Mil/uL   Platelets 267.0 150.0 - 400.0 K/uL   Hemoglobin 12.6 12.0 - 15.0 g/dL   HCT 16.1 09.6 - 04.5 %   MCV 86.6 78.0 - 100.0 fl   MCHC 32.3 30.0 - 36.0 g/dL   RDW 40.9 81.1 - 91.4 %  CBC     Status: Abnormal   Collection  Time: 08/31/22  8:25 AM  Result Value Ref Range   WBC 5.0 4.0 - 10.5 K/uL   RBC 4.30 3.87 - 5.11 MIL/uL   Hemoglobin 11.9 (L) 12.0 - 15.0 g/dL   HCT 78.2 95.6 - 21.3 %   MCV 89.1 80.0 - 100.0 fL   MCH 27.7 26.0 - 34.0 pg   MCHC 31.1 30.0 - 36.0 g/dL   RDW 08.6 57.8 - 46.9 %   Platelets 256 150 - 400 K/uL   nRBC 0.0 0.0 - 0.2 %    Comment: Performed at [facility], [address], [place], [place] [address]  Type and screen [facility] [place]     Status: None   Collection Time: 08/31/22  8:25 AM  Result Value Ref Range   ABO/RH(D) O POS    Antibody Screen NEG    Sample Expiration 09/14/2022,2359    Extend sample reason      NO TRANSFUSIONS OR PREGNANCY IN THE PAST 3 MONTHS Performed at [facility], [address], [place], [place] [address]    Assessment/Plan: Kaylee Carpenter is an 67 y.o. [phone] presenting for hysteroscopy, D&C, Mirena IUD insertion for evaluation and treatment of persistent thickened EMS. Surgery reviewed. Risks discussed including infection, bleeding, damage to surrounding structures, need for additional procedures, postoperative DVT. All questions answered. Consent signed.  Tawni Levy 09/05/2022, 10:23 AM

## 2022-09-06 ENCOUNTER — Encounter (HOSPITAL_BASED_OUTPATIENT_CLINIC_OR_DEPARTMENT_OTHER): Payer: Self-pay | Admitting: Obstetrics and Gynecology

## 2022-09-06 ENCOUNTER — Ambulatory Visit (HOSPITAL_BASED_OUTPATIENT_CLINIC_OR_DEPARTMENT_OTHER): Payer: Commercial Managed Care - PPO | Admitting: Anesthesiology

## 2022-09-06 ENCOUNTER — Other Ambulatory Visit: Payer: Self-pay

## 2022-09-06 ENCOUNTER — Ambulatory Visit (HOSPITAL_BASED_OUTPATIENT_CLINIC_OR_DEPARTMENT_OTHER)
Admission: RE | Admit: 2022-09-06 | Discharge: 2022-09-06 | Disposition: A | Discharge status: Home / Self Care | Payer: Commercial Managed Care - PPO | Source: Ambulatory Visit | Attending: Obstetrics and Gynecology | Admitting: Obstetrics and Gynecology

## 2022-09-06 ENCOUNTER — Encounter (INDEPENDENT_AMBULATORY_CARE_PROVIDER_SITE_OTHER): Payer: Self-pay

## 2022-09-06 ENCOUNTER — Encounter (HOSPITAL_BASED_OUTPATIENT_CLINIC_OR_DEPARTMENT_OTHER)
Admission: RE | Disposition: A | Discharge status: Home / Self Care | Payer: Self-pay | Source: Ambulatory Visit | Attending: Obstetrics and Gynecology

## 2022-09-06 DIAGNOSIS — E0500 Thyrotoxicosis with diffuse goiter without thyrotoxic crisis or storm: Secondary | ICD-10-CM | POA: Insufficient documentation

## 2022-09-06 DIAGNOSIS — R9389 Abnormal findings on diagnostic imaging of other specified body structures: Secondary | ICD-10-CM | POA: Insufficient documentation

## 2022-09-06 DIAGNOSIS — Z30430 Encounter for insertion of intrauterine contraceptive device: Secondary | ICD-10-CM | POA: Insufficient documentation

## 2022-09-06 DIAGNOSIS — Z6841 Body Mass Index (BMI) 40.0 and over, adult: Secondary | ICD-10-CM | POA: Diagnosis not present

## 2022-09-06 DIAGNOSIS — N8500 Endometrial hyperplasia, unspecified: Secondary | ICD-10-CM | POA: Diagnosis not present

## 2022-09-06 DIAGNOSIS — I10 Essential (primary) hypertension: Secondary | ICD-10-CM

## 2022-09-06 DIAGNOSIS — Z01818 Encounter for other preprocedural examination: Secondary | ICD-10-CM

## 2022-09-06 HISTORY — PX: DILATATION & CURETTAGE/HYSTEROSCOPY WITH MYOSURE: SHX6511

## 2022-09-06 HISTORY — DX: Presence of dental prosthetic device (complete) (partial): Z97.2

## 2022-09-06 HISTORY — DX: Unspecified osteoarthritis, unspecified site: M19.90

## 2022-09-06 HISTORY — PX: INTRAUTERINE DEVICE (IUD) INSERTION: SHX5877

## 2022-09-06 LAB — POCT I-STAT, CHEM 8
BUN: 15 mg/dL (ref 8–23)
Calcium, Ion: 1.12 mmol/L — ABNORMAL LOW (ref 1.15–1.40)
Chloride: 104 mmol/L (ref 98–111)
Creatinine, Ser: 0.9 mg/dL (ref 0.44–1.00)
Glucose, Bld: 119 mg/dL — ABNORMAL HIGH (ref 70–99)
HCT: 41 % (ref 36.0–46.0)
Hemoglobin: 13.9 g/dL (ref 12.0–15.0)
Potassium: 4.3 mmol/L (ref 3.5–5.1)
Sodium: 139 mmol/L (ref 135–145)
TCO2: 26 mmol/L (ref 22–32)

## 2022-09-06 LAB — ABO/RH: ABO/RH(D): O POS

## 2022-09-06 SURGERY — DILATATION & CURETTAGE/HYSTEROSCOPY WITH MYOSURE
Anesthesia: General | Site: Uterus

## 2022-09-06 MED ORDER — MIDAZOLAM HCL 2 MG/2ML IJ SOLN
INTRAMUSCULAR | Status: AC
  Filled 2022-09-06: qty 2

## 2022-09-06 MED ORDER — LEVONORGESTREL 20 MCG/DAY IU IUD
INTRAUTERINE_SYSTEM | INTRAUTERINE | Status: AC
  Filled 2022-09-06: qty 1

## 2022-09-06 MED ORDER — FENTANYL CITRATE (PF) 100 MCG/2ML IJ SOLN
INTRAMUSCULAR | Status: DC | PRN
  Administered 2022-09-06 (×4): 25 ug via INTRAVENOUS

## 2022-09-06 MED ORDER — ACETAMINOPHEN 500 MG PO TABS
1000.0000 mg | ORAL_TABLET | Freq: Once | ORAL | Status: AC
  Administered 2022-09-06: 1000 mg via ORAL

## 2022-09-06 MED ORDER — DEXAMETHASONE SODIUM PHOSPHATE 10 MG/ML IJ SOLN
INTRAMUSCULAR | Status: AC
  Filled 2022-09-06: qty 1

## 2022-09-06 MED ORDER — POVIDONE-IODINE 10 % EX SWAB: 2.0000 | Freq: Once | CUTANEOUS | Status: DC

## 2022-09-06 MED ORDER — FENTANYL CITRATE (PF) 100 MCG/2ML IJ SOLN: 25.0000 ug | INTRAMUSCULAR | Status: DC | PRN

## 2022-09-06 MED ORDER — LIDOCAINE HCL 1 % IJ SOLN
INTRAMUSCULAR | Status: DC | PRN
  Administered 2022-09-06: 10 mL

## 2022-09-06 MED ORDER — ONDANSETRON HCL 4 MG/2ML IJ SOLN
INTRAMUSCULAR | Status: AC
  Filled 2022-09-06: qty 2

## 2022-09-06 MED ORDER — LACTATED RINGERS IV SOLN: INTRAVENOUS | Status: DC

## 2022-09-06 MED ORDER — MIDAZOLAM HCL 5 MG/5ML IJ SOLN
INTRAMUSCULAR | Status: DC | PRN
  Administered 2022-09-06: 2 mg via INTRAVENOUS

## 2022-09-06 MED ORDER — SODIUM CHLORIDE 0.9 % IR SOLN
Status: DC | PRN
  Administered 2022-09-06: 3000 mL

## 2022-09-06 MED ORDER — DEXAMETHASONE SODIUM PHOSPHATE 4 MG/ML IJ SOLN
INTRAMUSCULAR | Status: DC | PRN
  Administered 2022-09-06: 5 mg via INTRAVENOUS

## 2022-09-06 MED ORDER — KETOROLAC TROMETHAMINE 30 MG/ML IJ SOLN
INTRAMUSCULAR | Status: DC | PRN
  Administered 2022-09-06: 30 mg via INTRAVENOUS

## 2022-09-06 MED ORDER — PROPOFOL 10 MG/ML IV BOLUS
INTRAVENOUS | Status: AC
  Filled 2022-09-06: qty 20

## 2022-09-06 MED ORDER — LIDOCAINE HCL (PF) 2 % IJ SOLN
INTRAMUSCULAR | Status: AC
  Filled 2022-09-06: qty 5

## 2022-09-06 MED ORDER — ACETAMINOPHEN 500 MG PO TABS
ORAL_TABLET | ORAL | Status: AC
  Filled 2022-09-06: qty 2

## 2022-09-06 MED ORDER — KETOROLAC TROMETHAMINE 30 MG/ML IJ SOLN
INTRAMUSCULAR | Status: AC
  Filled 2022-09-06: qty 1

## 2022-09-06 MED ORDER — FENTANYL CITRATE (PF) 100 MCG/2ML IJ SOLN
INTRAMUSCULAR | Status: AC
  Filled 2022-09-06: qty 2

## 2022-09-06 MED ORDER — LEVONORGESTREL 20 MCG/DAY IU IUD
1.0000 | INTRAUTERINE_SYSTEM | INTRAUTERINE | Status: AC
  Administered 2022-09-06: 1 via INTRAUTERINE

## 2022-09-06 MED ORDER — ONDANSETRON HCL 4 MG/2ML IJ SOLN
INTRAMUSCULAR | Status: DC | PRN
  Administered 2022-09-06: 4 mg via INTRAVENOUS

## 2022-09-06 MED ORDER — PROPOFOL 10 MG/ML IV BOLUS
INTRAVENOUS | Status: DC | PRN
Start: 2022-09-06 — End: 2022-09-06
  Administered 2022-09-06: 200 mg via INTRAVENOUS

## 2022-09-06 MED ORDER — LIDOCAINE HCL (CARDIAC) PF 100 MG/5ML IV SOSY
PREFILLED_SYRINGE | INTRAVENOUS | Status: DC | PRN
  Administered 2022-09-06: 100 mg via INTRAVENOUS

## 2022-09-06 SURGICAL SUPPLY — 23 items
CATH ROBINSON RED A/P 16FR (CATHETERS) IMPLANT
DEVICE MYOSURE LITE (MISCELLANEOUS) IMPLANT
DEVICE MYOSURE REACH (MISCELLANEOUS) IMPLANT
DILATOR CANAL MILEX (MISCELLANEOUS) IMPLANT
GAUZE 4X4 16PLY ~~LOC~~+RFID DBL (SPONGE) IMPLANT
GLOVE BIOGEL PI IND STRL 6 (GLOVE) ×1 IMPLANT
GLOVE BIOGEL PI IND STRL 7.0 (GLOVE) IMPLANT
GLOVE SS PI 5.5 STRL (GLOVE) ×1 IMPLANT
GOWN STRL REUS W/ TWL LRG LVL3 (GOWN DISPOSABLE) ×1 IMPLANT
GOWN STRL REUS W/TWL LRG LVL3 (GOWN DISPOSABLE) ×2
IV NS IRRIG 3000ML ARTHROMATIC (IV SOLUTION) ×1 IMPLANT
KIT PROCEDURE FLUENT (KITS) ×1 IMPLANT
KIT TURNOVER CYSTO (KITS) ×1 IMPLANT
MYOSURE XL FIBROID (MISCELLANEOUS)
Mirena IUD IMPLANT
PACK VAGINAL MINOR WOMEN LF (CUSTOM PROCEDURE TRAY) ×1 IMPLANT
PAD OB MATERNITY 4.3X12.25 (PERSONAL CARE ITEMS) ×1 IMPLANT
PAD PREP 24X48 CUFFED NSTRL (MISCELLANEOUS) ×1 IMPLANT
SEAL CERVICAL OMNI LOK (ABLATOR) IMPLANT
SEAL ROD LENS SCOPE MYOSURE (ABLATOR) ×1 IMPLANT
SLEEVE SCD COMPRESS KNEE MED (STOCKING) ×1 IMPLANT
SYSTEM TISS REMOVAL MYOSURE XL (MISCELLANEOUS) IMPLANT
TOWEL OR 17X24 6PK STRL BLUE (TOWEL DISPOSABLE) ×1 IMPLANT

## 2022-09-06 NOTE — Discharge Instructions (Signed)

## 2022-09-06 NOTE — Anesthesia Postprocedure Evaluation (Signed)
Anesthesia Post Note  Patient: Kaylee Carpenter  Procedure(s) Performed: DILATATION & CURETTAGE/HYSTEROSCOPY (Uterus) INTRAUTERINE DEVICE (IUD) INSERTION (Uterus)     Patient location during evaluation: PACU Anesthesia Type: General Level of consciousness: awake and alert Pain management: pain level controlled Vital Signs Assessment: post-procedure vital signs reviewed and stable Respiratory status: spontaneous breathing, nonlabored ventilation, respiratory function stable and patient connected to nasal cannula oxygen Cardiovascular status: blood pressure returned to baseline and stable Postop Assessment: no apparent nausea or vomiting Anesthetic complications: no  No notable events documented.  Last Vitals:  Vitals:   09/06/22 0901 09/06/22 0945  BP: 120/65 120/70  Pulse: (!) 53 (!) 54  Resp: 16 16  Temp: (!) 36.1 C   SpO2: 97% 98%    Last Pain:  Vitals:   09/06/22 0945  TempSrc:   PainSc: 0-No pain                  L 

## 2022-09-06 NOTE — Anesthesia Procedure Notes (Signed)
Procedure Name: LMA Insertion Date/Time: 09/06/2022 7:47 AM  Performed by: Jessica Priest, CRNAPre-anesthesia Checklist: Patient identified, Emergency Drugs available, Suction available, Patient being monitored and Timeout performed Patient Re-evaluated:Patient Re-evaluated prior to induction Oxygen Delivery Method: Circle system utilized Preoxygenation: Pre-oxygenation with 100% oxygen Induction Type: IV induction Ventilation: Mask ventilation without difficulty LMA: LMA inserted LMA Size: 4.0 Number of attempts: 1 Airway Equipment and Method: Bite block Placement Confirmation: positive ETCO2, breath sounds checked- equal and bilateral and CO2 detector Tube secured with: Tape Dental Injury: Teeth and Oropharynx as per pre-operative assessment

## 2022-09-06 NOTE — Op Note (Signed)
PREOPERATIVE DIAGNOSES: 1. Thickened endometrial stripe in postmenopausal woman  POSTOPERATIVE DIAGNOSES: Same  PROCEDURE PERFORMED: Hysteroscopy, dilation and curettage, Mirena IUD insertion  SURGEON: Dr. Jule Economy  ANESTHESIA: General  ESTIMATED BLOOD LOSS: 5cc.  FLUID DEFICIT: 15ccs  COMPLICATIONS: None  TUBES: None.  DRAINS: None  PATHOLOGY: Endometrial curettings  FINDINGS: On exam, under anesthesia, normal appearing vulva and vagina. Hysteroscopic eval of endometrium with normal cavity, bilateral tubal ostia visualized. Endometrial tissue appears atrophic, no evidence of hyperplasia on hysteroscopic evaluation.  Procedure: The patient was taken to the operating room where she was properly prepped and draped in sterile manner under general anesthesia. After bimanual examination, the cervix was exposed with a speculum and the anterior lip of the cervix grasped with a tenaculum. Paracervical block performed. The endocervical canal was then progressively dilated. The hysteroscope was introduced into the uterus and an assessment of the cavity was performed. Bilateral tubal ostia were visualized. A sharp curette was introduced and a curettage was then performed. The hysteroscope was reintroduced for a post-curettage survey with good uterine distension. Mirena IUD was placed in standard fashion and strings were trimmed to 2cm. All instruments were removed from vagina. Excellent hemostasis noted. The sponge and lap counts were correct x2 at this time. The patient's procedure was terminated. We then awakened her. She was sent to the Recovery Room in good condition.   Jule Economy, MD

## 2022-09-06 NOTE — Transfer of Care (Signed)
Immediate Anesthesia Transfer of Care Note  Patient: Kaylee Carpenter  Procedure(s) Performed: Procedure(s) (LRB): DILATATION & CURETTAGE/HYSTEROSCOPY (N/A) INTRAUTERINE DEVICE (IUD) INSERTION (N/A)  Patient Location: PACU  Anesthesia Type: General  Level of Consciousness: awake, sedated, patient cooperative and responds to stimulation  Airway & Oxygen Therapy: Patient Spontanous Breathing and Patient connected to [place] oxygen  Post-op Assessment: Report given to PACU RN, Post -op Vital signs reviewed and stable and Patient moving all extremities  Post vital signs: Reviewed and stable  Complications: No apparent anesthesia complications

## 2022-09-06 NOTE — Interval H&P Note (Signed)
History and Physical Interval Note:  09/06/2022 7:23 AM  Kaylee Carpenter  has presented today for surgery, with the diagnosis of THICKENED ENDOMETRIUM.  The various methods of treatment have been discussed with the patient and family. After consideration of risks, benefits and other options for treatment, the patient has consented to  Procedure(s): DILATATION & CURETTAGE/HYSTEROSCOPY WITH POSSIBLE MYOSURE LITE (N/A) INTRAUTERINE DEVICE (IUD) INSERTION (N/A) as a surgical intervention.  The patient's history has been reviewed, patient examined, no change in status, stable for surgery.  I have reviewed the patient's chart and labs.  Questions were answered to the patient's satisfaction.     Tawni Levy

## 2022-09-06 NOTE — Anesthesia Preprocedure Evaluation (Addendum)
Anesthesia Evaluation  Patient identified by MRN, date of birth, ID band Patient awake    Reviewed: Allergy & Precautions, NPO status , Patient's Chart, lab work & pertinent test results, reviewed documented beta blocker date and time   Airway Mallampati: III  TM Distance: >3 FB Neck ROM: Full    Dental  (+) Edentulous Upper, Upper Dentures, Missing,    Pulmonary sleep apnea (no CPAP)    Pulmonary exam normal breath sounds clear to auscultation       Cardiovascular hypertension, Pt. on home beta blockers and Pt. on medications + Peripheral Vascular Disease  Normal cardiovascular exam Rhythm:Regular Rate:Normal     Neuro/Psych negative neurological ROS  negative psych ROS   GI/Hepatic negative GI ROS, Neg liver ROS,,,  Endo/Other    Morbid obesity (BMI 50)Grave's disease  Renal/GU negative Renal ROS  negative genitourinary   Musculoskeletal  (+) Arthritis ,    Abdominal   Peds  Hematology negative hematology ROS (+)   Anesthesia Other Findings   Reproductive/Obstetrics                             Anesthesia Physical Anesthesia Plan  ASA: 3  Anesthesia Plan: General   Post-op Pain Management: Tylenol PO (pre-op)*   Induction: Intravenous  PONV Risk Score and Plan: 3 and Ondansetron, Dexamethasone and Midazolam  Airway Management Planned: LMA  Additional Equipment:   Intra-op Plan:   Post-operative Plan: Extubation in OR  Informed Consent: I have reviewed the patients History and Physical, chart, labs and discussed the procedure including the risks, benefits and alternatives for the proposed anesthesia with the patient or authorized representative who has indicated his/her understanding and acceptance.     Dental advisory given  Plan Discussed with: CRNA  Anesthesia Plan Comments:        Anesthesia Quick Evaluation

## 2022-09-07 ENCOUNTER — Encounter (HOSPITAL_BASED_OUTPATIENT_CLINIC_OR_DEPARTMENT_OTHER): Payer: Self-pay | Admitting: Obstetrics and Gynecology

## 2022-09-07 LAB — SURGICAL PATHOLOGY

## 2022-09-25 ENCOUNTER — Other Ambulatory Visit: Payer: Self-pay | Admitting: Nurse Practitioner

## 2022-09-25 DIAGNOSIS — E785 Hyperlipidemia, unspecified: Secondary | ICD-10-CM

## 2022-09-27 ENCOUNTER — Other Ambulatory Visit (INDEPENDENT_AMBULATORY_CARE_PROVIDER_SITE_OTHER): Payer: Commercial Managed Care - PPO

## 2022-09-27 DIAGNOSIS — E785 Hyperlipidemia, unspecified: Secondary | ICD-10-CM | POA: Diagnosis not present

## 2022-09-27 LAB — COMPREHENSIVE METABOLIC PANEL
ALT: 11 U/L (ref 0–35)
AST: 12 U/L (ref 0–37)
Albumin: 3.8 g/dL (ref 3.5–5.2)
Alkaline Phosphatase: 90 U/L (ref 39–117)
BUN: 12 mg/dL (ref 6–23)
CO2: 29 meq/L (ref 19–32)
Calcium: 10.2 mg/dL (ref 8.4–10.5)
Chloride: 104 meq/L (ref 96–112)
Creatinine, Ser: 1.01 mg/dL (ref 0.40–1.20)
GFR: 56.98 mL/min — ABNORMAL LOW (ref >60.00)
Glucose, Bld: 100 mg/dL — ABNORMAL HIGH (ref 70–99)
Potassium: 3.5 meq/L (ref 3.5–5.1)
Sodium: 139 meq/L (ref 135–145)
Total Bilirubin: 0.3 mg/dL (ref 0.2–1.2)
Total Protein: 7.5 g/dL (ref 6.0–8.3)

## 2022-09-27 LAB — LIPID PANEL
Cholesterol: 133 mg/dL (ref 0–200)
HDL: 55.2 mg/dL (ref >39.00)
LDL Cholesterol: 60 mg/dL (ref 0–99)
NonHDL: 77.58
Total CHOL/HDL Ratio: 2
Triglycerides: 87 mg/dL (ref 0.0–149.0)
VLDL: 17.4 mg/dL (ref 0.0–40.0)

## 2022-09-28 ENCOUNTER — Other Ambulatory Visit: Payer: Self-pay | Admitting: Nurse Practitioner

## 2022-09-28 DIAGNOSIS — I10 Essential (primary) hypertension: Secondary | ICD-10-CM

## 2022-09-28 DIAGNOSIS — E785 Hyperlipidemia, unspecified: Secondary | ICD-10-CM

## 2022-09-28 DIAGNOSIS — R9389 Abnormal findings on diagnostic imaging of other specified body structures: Secondary | ICD-10-CM

## 2022-09-28 DIAGNOSIS — R7303 Prediabetes: Secondary | ICD-10-CM

## 2022-10-04 ENCOUNTER — Ambulatory Visit (INDEPENDENT_AMBULATORY_CARE_PROVIDER_SITE_OTHER): Payer: Commercial Managed Care - PPO | Admitting: Nurse Practitioner

## 2022-10-04 VITALS — BP 130/76 | HR 70 | Temp 98.0°F | Ht 66.0 in | Wt 316.1 lb

## 2022-10-04 DIAGNOSIS — E559 Vitamin D deficiency, unspecified: Secondary | ICD-10-CM | POA: Diagnosis not present

## 2022-10-04 DIAGNOSIS — R5383 Other fatigue: Secondary | ICD-10-CM

## 2022-10-04 DIAGNOSIS — N950 Postmenopausal bleeding: Secondary | ICD-10-CM | POA: Diagnosis not present

## 2022-10-04 DIAGNOSIS — R7303 Prediabetes: Secondary | ICD-10-CM | POA: Diagnosis not present

## 2022-10-04 NOTE — Assessment & Plan Note (Signed)
Likely now related to IUD and will hopefully wane/stop within a few weeks. However, patient educated to discuss further with OBGYN to ensure this is not a sign of serious etiology. She reports understanding and will reach out to them.

## 2022-10-04 NOTE — Assessment & Plan Note (Signed)
Chronic Patient to come back to lab when it is open to have serum level checked. Additional recommendation may be made based on result

## 2022-10-04 NOTE — Assessment & Plan Note (Signed)
Labs ordered, further recommendations may be made based upon these results. 

## 2022-10-04 NOTE — Progress Notes (Signed)
10/04/2022  Patient ID: Kaylee Carpenter, female   DOB: Jul 09, 1955, 67 y.o.   MRN: [id number]  Clinic routed request from PCP, Jiles Prows, NP, to assess safety of Fluad HD, Prevnar 20, and Shingrix immunizations in setting of latex allergy.  Per the CDC, immunizations are only contraindicated if the latex allergy reaction is anaphylaxis,  Of note, none of the above immunizations contain latex either in the vaccine ingredients or as part of their vial/syringe.  Fluad HD, Prevnar 20, and Shingrix immunizations are safe for patient administration based on information found through [employer] and product [employer].  Provider made aware.  Lenna Gilford, PharmD, DPLA

## 2022-10-04 NOTE — Progress Notes (Signed)
Established Patient Office Visit  Subjective   Patient ID: Kaylee Carpenter, female    DOB: [birth year]  Age: 67 y.o. MRN: [id number]  Chief Complaint  Patient presents with   Hyperlipidemia    Hyperlipidemia/Prediabetes: Last LDL 60, last A1C 6.2. On Atorvastatin 10mg /day, tolerating well.  Postmenopausal vaginal bleeding/thickened endometrium/Fatigue: Noticed since having IUD placed about one month ago. Has had vaginal bleeding constantly since then. This was done by OBGYN as part of treatment for endometrial thickening.  Vitamin D Deficiency: Last vitamin D level 16.94, on vitamin D3 - 2000IUs by mouth daily    ROS: see HPI    Objective:     BP 130/76   Pulse 70   Temp 98 F (36.7 C) (Temporal)   Ht 5\' 6"  (1.676 m)   Wt (!) 316 lb 2 oz (143.4 kg)   SpO2 94%   BMI 51.02 kg/m    Physical Exam Vitals reviewed.  Constitutional:      General: She is not in acute distress.    Appearance: Normal appearance.  HENT:     Head: Normocephalic and atraumatic.  Cardiovascular:     Rate and Rhythm: Normal rate and regular rhythm.     Pulses: Normal pulses.     Heart sounds: Normal heart sounds.  Pulmonary:     Effort: Pulmonary effort is normal.     Breath sounds: Normal breath sounds.  Skin:    General: Skin is warm and dry.  Neurological:     General: No focal deficit present.     Mental Status: She is alert and oriented to person, place, and time.  Psychiatric:        Mood and Affect: Mood normal.        Behavior: Behavior normal.        Judgment: Judgment normal.      No results found for any visits on 10/04/22.    The 10-year ASCVD risk score (Arnett DK, et al., 2019) is: 8.1%    Assessment & Plan:   Problem List Items Addressed This Visit       Other   Vitamin D deficiency    Chronic Patient to come back to lab when it is open to have serum level checked. Additional recommendation may be made based on result      Relevant Orders    Hemoglobin A1c   TSH   CBC   Iron   Ferritin   VITAMIN D 25 Hydroxy (Vit-D Deficiency, Fractures)   Comprehensive metabolic panel   Prediabetes    Chronic,  Labs ordered, further recommendations may be made based upon these results       Relevant Orders   Hemoglobin A1c   TSH   CBC   Iron   Ferritin   VITAMIN D 25 Hydroxy (Vit-D Deficiency, Fractures)   Comprehensive metabolic panel   Fatigue - Primary    Labs ordered, further recommendations may be made based upon these results       Relevant Orders   Hemoglobin A1c   TSH   CBC   Iron   Ferritin   VITAMIN D 25 Hydroxy (Vit-D Deficiency, Fractures)   Comprehensive metabolic panel   Post-menopausal bleeding    Likely now related to IUD and will hopefully wane/stop within a few weeks. However, patient educated to discuss further with OBGYN to ensure this is not a sign of serious etiology. She reports understanding and will reach out to them.  Relevant Orders   Hemoglobin A1c   TSH   CBC   Iron   Ferritin   VITAMIN D 25 Hydroxy (Vit-D Deficiency, Fractures)   Comprehensive metabolic panel    Return in about 6 months (around 04/03/2023) for F/U with Ruchy Wildrick.    Elenore Paddy, NP

## 2022-10-04 NOTE — Assessment & Plan Note (Signed)
Chronic, Labs ordered, further recommendations may be made based upon these results

## 2022-10-04 NOTE — Patient Instructions (Addendum)
Please ask pharmacist about flu and shingles vaccine. Please notify them of your latex allergy  I will notify you of your lab results once they become available

## 2022-10-25 ENCOUNTER — Other Ambulatory Visit: Payer: Self-pay | Admitting: Nurse Practitioner

## 2022-10-25 ENCOUNTER — Ambulatory Visit (INDEPENDENT_AMBULATORY_CARE_PROVIDER_SITE_OTHER): Payer: Commercial Managed Care - PPO | Admitting: Nurse Practitioner

## 2022-10-25 VITALS — BP 116/74 | HR 77 | Temp 98.3°F | Ht 66.0 in | Wt 311.0 lb

## 2022-10-25 DIAGNOSIS — N950 Postmenopausal bleeding: Secondary | ICD-10-CM

## 2022-10-25 DIAGNOSIS — L989 Disorder of the skin and subcutaneous tissue, unspecified: Secondary | ICD-10-CM | POA: Diagnosis not present

## 2022-10-25 DIAGNOSIS — I739 Peripheral vascular disease, unspecified: Secondary | ICD-10-CM | POA: Diagnosis not present

## 2022-10-25 DIAGNOSIS — E559 Vitamin D deficiency, unspecified: Secondary | ICD-10-CM

## 2022-10-25 DIAGNOSIS — R5383 Other fatigue: Secondary | ICD-10-CM

## 2022-10-25 DIAGNOSIS — R7303 Prediabetes: Secondary | ICD-10-CM | POA: Diagnosis not present

## 2022-10-25 DIAGNOSIS — I10 Essential (primary) hypertension: Secondary | ICD-10-CM | POA: Diagnosis not present

## 2022-10-25 LAB — CBC
HCT: 39.8 % (ref 36.0–46.0)
Hemoglobin: 12.7 g/dL (ref 12.0–15.0)
MCHC: 32 g/dL (ref 30.0–36.0)
MCV: 86 fL (ref 78.0–100.0)
Platelets: 280 10*3/uL (ref 150.0–400.0)
RBC: 4.63 Mil/uL (ref 3.87–5.11)
RDW: 14.1 % (ref 11.5–15.5)
WBC: 5.1 10*3/uL (ref 4.0–10.5)

## 2022-10-25 LAB — COMPREHENSIVE METABOLIC PANEL
ALT: 12 U/L (ref 0–35)
AST: 15 U/L (ref 0–37)
Albumin: 3.9 g/dL (ref 3.5–5.2)
Alkaline Phosphatase: 97 U/L (ref 39–117)
BUN: 12 mg/dL (ref 6–23)
CO2: 29 meq/L (ref 19–32)
Calcium: 9.9 mg/dL (ref 8.4–10.5)
Chloride: 102 meq/L (ref 96–112)
Creatinine, Ser: 1.01 mg/dL (ref 0.40–1.20)
GFR: 56.95 mL/min — ABNORMAL LOW (ref >60.00)
Glucose, Bld: 130 mg/dL — ABNORMAL HIGH (ref 70–99)
Potassium: 4 meq/L (ref 3.5–5.1)
Sodium: 137 meq/L (ref 135–145)
Total Bilirubin: 0.4 mg/dL (ref 0.2–1.2)
Total Protein: 7.5 g/dL (ref 6.0–8.3)

## 2022-10-25 LAB — IRON: Iron: 71 ug/dL (ref 42–145)

## 2022-10-25 LAB — FERRITIN: Ferritin: 123.4 ng/mL (ref 10.0–291.0)

## 2022-10-25 LAB — VITAMIN D 25 HYDROXY (VIT D DEFICIENCY, FRACTURES): VITD: 18.64 ng/mL — ABNORMAL LOW (ref 30.00–100.00)

## 2022-10-25 LAB — HEMOGLOBIN A1C: Hgb A1c MFr Bld: 6.6 % — ABNORMAL HIGH (ref 4.6–6.5)

## 2022-10-25 LAB — TSH: TSH: 1.28 u[IU]/mL (ref 0.35–5.50)

## 2022-10-25 MED ORDER — MUPIROCIN 2 % EX OINT: 1.0000 | TOPICAL_OINTMENT | Freq: Two times a day (BID) | CUTANEOUS | 0 refills | Status: DC

## 2022-10-25 MED ORDER — TORSEMIDE 20 MG PO TABS
20.0000 mg | ORAL_TABLET | Freq: Every day | ORAL | 1 refills | Status: DC
Start: 2022-10-25 — End: 2022-10-25

## 2022-10-25 MED ORDER — SPIRONOLACTONE 25 MG PO TABS
25.0000 mg | ORAL_TABLET | Freq: Every day | ORAL | 1 refills | Status: DC
Start: 2022-10-25 — End: 2023-06-03

## 2022-10-25 NOTE — Assessment & Plan Note (Signed)
Chronic Stable and well-controlled on current regimen today. Continue carvedilol 12.5 mg twice daily, spironolactone 25 mg daily, will change from furosemide to torsemide 20 mg daily.  Patient to check CMP today, and return in 2 weeks for blood pressure check as well as to recheck CMP.

## 2022-10-25 NOTE — Assessment & Plan Note (Signed)
Chronic I think this is the most likely etiology of her thinning of the skin to her right lower extremity.  I encouraged her again to elevate legs when possible, use compression stockings when possible, and to avoid excessive salt intake in diet.  She reports her understanding.  Will also change from furosemide to torsemide to see if this gets better diuretic effect.  Patient to return in 2 weeks for close monitoring of blood pressure as well as to recheck metabolic panel.

## 2022-10-25 NOTE — Assessment & Plan Note (Addendum)
Etiology unclear, but I suspect this could be related to her peripheral vascular disease. Will prescribe mupirocin ointment that she can use as needed for any abrasions or opening in the skin to prevent secondary infection. Will also refer to dermatology just for assistance with evaluation to ensure there is no other underlying cause that could be related.

## 2022-10-25 NOTE — Assessment & Plan Note (Signed)
Labs ordered, further recommendations may be made based upon these results. 

## 2022-10-25 NOTE — Assessment & Plan Note (Signed)
Labs ordered, further recommendations may be made based upon his results. 

## 2022-10-25 NOTE — Progress Notes (Addendum)
Established Patient Office Visit  Subjective   Patient ID: Kaylee Carpenter, female    DOB: [birth year]  Age: 67 y.o. MRN: [id number]  Chief Complaint  Patient presents with  . Medical Management of Chronic Issues    Right leg skin issue, skin peeling    Peripheral Vascular Disease/HTN: Chronic.  Currently on furosemide and spironolactone as well as carvedilol for hypertension as well as to manage edema.  She also reports elevating legs intermittently.  Does not tolerate compression stockings very well.  Reports that on her right leg she had some skin sloughing, denies having any trauma to the leg.  No pain right now.  Abnormal vaginal bleeding: Reports having been experiencing vaginal spotting and bleeding since she had IUD placed approximate 2 months ago. Has not yet followed up with OB/GYN.  Patient had been instructed to return to lab after last office visit to have her labs collected, she did not do so.  But she is here today to have these collected.    ROS: see HPI    Objective:     BP 116/74   Pulse 77   Temp 98.3 F (36.8 C) (Temporal)   Ht 5\' 6"  (1.676 m)   Wt (!) 311 lb (141.1 kg)   SpO2 94%   BMI 50.20 kg/m    Physical Exam Vitals reviewed.  Constitutional:      General: She is not in acute distress.    Appearance: Normal appearance.  HENT:     Head: Normocephalic and atraumatic.  Neck:     Vascular: No carotid bruit.  Cardiovascular:     Rate and Rhythm: Normal rate and regular rhythm.     Pulses: Normal pulses.     Heart sounds: Normal heart sounds.  Pulmonary:     Effort: Pulmonary effort is normal.     Breath sounds: Normal breath sounds.  Skin:    General: Skin is warm and dry.          Comments: Red line indicates area of thinning/hypopigmented skin. No open/draining lesions. No redness, swelling, heat, or tenderness  Neurological:     General: No focal deficit present.     Mental Status: She is alert and oriented to person, place, and  time.  Psychiatric:        Mood and Affect: Mood normal.        Behavior: Behavior normal.        Judgment: Judgment normal.     Results for orders placed or performed in visit on 10/25/22  Comprehensive metabolic panel  Result Value Ref Range   Sodium 137 135 - 145 mEq/L   Potassium 4.0 3.5 - 5.1 mEq/L   Chloride 102 96 - 112 mEq/L   CO2 29 19 - 32 mEq/L   Glucose, Bld 130 (H) 70 - 99 mg/dL   BUN 12 6 - 23 mg/dL   Creatinine, Ser 1.91 0.40 - 1.20 mg/dL   Total Bilirubin 0.4 0.2 - 1.2 mg/dL   Alkaline Phosphatase 97 39 - 117 U/L   AST 15 0 - 37 U/L   ALT 12 0 - 35 U/L   Total Protein 7.5 6.0 - 8.3 g/dL   Albumin 3.9 3.5 - 5.2 g/dL   GFR 47.82 (L) >95.62 mL/min   Calcium 9.9 8.4 - 10.5 mg/dL  VITAMIN D 25 Hydroxy (Vit-D Deficiency, Fractures)  Result Value Ref Range   VITD 18.64 (L) 30.00 - 100.00 ng/mL  Ferritin  Result Value Ref Range  Ferritin 123.4 10.0 - 291.0 ng/mL  Iron  Result Value Ref Range   Iron 71 42 - 145 ug/dL  CBC  Result Value Ref Range   WBC 5.1 4.0 - 10.5 K/uL   RBC 4.63 3.87 - 5.11 Mil/uL   Platelets 280.0 150.0 - 400.0 K/uL   Hemoglobin 12.7 12.0 - 15.0 g/dL   HCT 16.1 09.6 - 04.5 %   MCV 86.0 78.0 - 100.0 fl   MCHC 32.0 30.0 - 36.0 g/dL   RDW 40.9 81.1 - 91.4 %  TSH  Result Value Ref Range   TSH 1.28 0.35 - 5.50 uIU/mL  Hemoglobin A1c  Result Value Ref Range   Hgb A1c MFr Bld 6.6 (H) 4.6 - 6.5 %      The 10-year ASCVD risk score (Arnett DK, et al., 2019) is: 6.5%    Assessment & Plan:   Problem List Items Addressed This Visit       Cardiovascular and Mediastinum   Hypertension    Chronic Stable and well-controlled on current regimen today. Continue carvedilol 12.5 mg twice daily, spironolactone 25 mg daily, will change from furosemide to torsemide 20 mg daily.  Patient to check CMP today, and return in 2 weeks for blood pressure check as well as to recheck CMP.      Relevant Medications   spironolactone (ALDACTONE) 25 MG  tablet   Peripheral vascular disease (HCC)    Chronic I think this is the most likely etiology of her thinning of the skin to her right lower extremity.  I encouraged her again to elevate legs when possible, use compression stockings when possible, and to avoid excessive salt intake in diet.  She reports her understanding.  Will also change from furosemide to torsemide to see if this gets better diuretic effect.  Patient to return in 2 weeks for close monitoring of blood pressure as well as to recheck metabolic panel.      Relevant Medications   spironolactone (ALDACTONE) 25 MG tablet     Musculoskeletal and Integument   Skin lesion - Primary    Etiology unclear, but I suspect this could be related to her peripheral vascular disease. Will prescribe mupirocin ointment that she can use as needed for any abrasions or opening in the skin to prevent secondary infection. Will also refer to dermatology just for assistance with evaluation to ensure there is no other underlying cause that could be related.      Relevant Medications   mupirocin ointment (BACTROBAN) 2 %   Other Relevant Orders   Ambulatory referral to Dermatology     Other   Vitamin D deficiency    Labs ordered, further recommendations may be made based upon these results For now continue on vitamin D 3 supplement.      Prediabetes    Labs ordered, further recommendations may be made based upon his results       Fatigue    Labs ordered, further recommendations may be made based upon these results       Post-menopausal bleeding    I again recommend evaluation with OB/GYN.  She reports understanding.  Will check labs as well to rule out anemia, further recommendations may be made based on the results.       Return in about 2 weeks (around 11/08/2022) for F/U with Alexiana Laverdure.    Elenore Paddy, NP

## 2022-10-25 NOTE — Assessment & Plan Note (Signed)
Labs ordered, further recommendations may be made based upon these results For now continue on vitamin D 3 supplement.

## 2022-10-25 NOTE — Assessment & Plan Note (Signed)
I again recommend evaluation with OB/GYN.  She reports understanding.  Will check labs as well to rule out anemia, further recommendations may be made based on the results.

## 2022-11-09 ENCOUNTER — Ambulatory Visit (INDEPENDENT_AMBULATORY_CARE_PROVIDER_SITE_OTHER): Payer: Commercial Managed Care - PPO | Admitting: Nurse Practitioner

## 2022-11-09 VITALS — BP 112/76 | HR 75 | Temp 97.8°F | Ht 66.0 in | Wt 311.4 lb

## 2022-11-09 DIAGNOSIS — I739 Peripheral vascular disease, unspecified: Secondary | ICD-10-CM | POA: Diagnosis not present

## 2022-11-09 LAB — BASIC METABOLIC PANEL
BUN: 16 mg/dL (ref 6–23)
CO2: 31 meq/L (ref 19–32)
Calcium: 10.7 mg/dL — ABNORMAL HIGH (ref 8.4–10.5)
Chloride: 100 meq/L (ref 96–112)
Creatinine, Ser: 1.01 mg/dL (ref 0.40–1.20)
GFR: 56.93 mL/min — ABNORMAL LOW (ref >60.00)
Glucose, Bld: 114 mg/dL — ABNORMAL HIGH (ref 70–99)
Potassium: 3.9 meq/L (ref 3.5–5.1)
Sodium: 139 meq/L (ref 135–145)

## 2022-11-09 NOTE — Assessment & Plan Note (Signed)
Chronic Continue spironolactone 25 mg daily and torsemide 20 mg daily.  Recheck BMP to monitor for stability in kidney function electrolytes.  Further recommendations may be made based on the results.

## 2022-11-09 NOTE — Progress Notes (Signed)
Established Patient Office Visit  Subjective   Patient ID: Kaylee Carpenter, female    DOB: 1955/08/23  Age: 67 y.o. MRN: [id number]  Chief Complaint  Patient presents with   Leg Swelling    At last office visit we switched from furosemide to torsemide 20 mg daily.  Patient also continues on spironolactone 25 mg daily and carvedilol 12.5 mg twice a day.  She reports tolerating the switch to torsemide well.  Has noticed less swelling in lower extremities since switching.  Here to have metabolic panel rechecked and blood pressure rechecked.    Review of Systems  Respiratory:  Negative for shortness of breath.   Cardiovascular:  Negative for chest pain.      Objective:     BP 112/76   Pulse 75   Temp 97.8 F (36.6 C) (Temporal)   Ht 5\' 6"  (1.676 m)   Wt (!) 311 lb 6 oz (141.2 kg)   SpO2 99%   BMI 50.26 kg/m  BP Readings from Last 3 Encounters:  11/09/22 112/76  10/25/22 116/74  10/04/22 130/76   Wt Readings from Last 3 Encounters:  11/09/22 (!) 311 lb 6 oz (141.2 kg)  10/25/22 (!) 311 lb (141.1 kg)  10/04/22 (!) 316 lb 2 oz (143.4 kg)      Physical Exam Vitals reviewed.  Constitutional:      General: She is not in acute distress.    Appearance: Normal appearance.  HENT:     Head: Normocephalic and atraumatic.  Cardiovascular:     Rate and Rhythm: Normal rate and regular rhythm.     Pulses: Normal pulses.     Heart sounds: Normal heart sounds.  Pulmonary:     Effort: Pulmonary effort is normal.     Breath sounds: Normal breath sounds.  Skin:    General: Skin is warm and dry.  Neurological:     General: No focal deficit present.     Mental Status: She is alert and oriented to person, place, and time.  Psychiatric:        Mood and Affect: Mood normal.        Behavior: Behavior normal.        Judgment: Judgment normal.      No results found for any visits on 11/09/22.    The 10-year ASCVD risk score (Arnett DK, et al., 2019) is: 6.1%     Assessment & Plan:   Problem List Items Addressed This Visit       Cardiovascular and Mediastinum   Peripheral vascular disease (HCC) - Primary    Chronic Continue spironolactone 25 mg daily and torsemide 20 mg daily.  Recheck BMP to monitor for stability in kidney function electrolytes.  Further recommendations may be made based on the results.      Relevant Orders   Basic metabolic panel    Return for f/u as scheduled.    Elenore Paddy, NP

## 2022-11-14 ENCOUNTER — Other Ambulatory Visit: Payer: Self-pay | Admitting: Nurse Practitioner

## 2022-11-28 ENCOUNTER — Other Ambulatory Visit: Payer: Self-pay | Admitting: Nurse Practitioner

## 2022-11-28 DIAGNOSIS — L989 Disorder of the skin and subcutaneous tissue, unspecified: Secondary | ICD-10-CM

## 2023-02-14 ENCOUNTER — Ambulatory Visit: Payer: Commercial Managed Care - PPO | Admitting: Family Medicine

## 2023-02-14 ENCOUNTER — Encounter: Payer: Self-pay | Admitting: Family Medicine

## 2023-02-14 ENCOUNTER — Other Ambulatory Visit: Payer: Self-pay

## 2023-02-14 ENCOUNTER — Ambulatory Visit (INDEPENDENT_AMBULATORY_CARE_PROVIDER_SITE_OTHER): Payer: Commercial Managed Care - PPO

## 2023-02-14 VITALS — BP 120/78 | HR 59 | Ht 66.0 in | Wt 314.0 lb

## 2023-02-14 DIAGNOSIS — M25561 Pain in right knee: Secondary | ICD-10-CM

## 2023-02-14 NOTE — Progress Notes (Signed)
I, Stevenson Clinch, CMA acting as a scribe for Clementeen Graham, MD.  Kaylee Carpenter is a 68 y.o. female who presents to [facility] today for right leg pain.  Patient was last seen by Dr. Denyse Amass on 04/02/2022 for low back pain and facet injections were ordered bilaterally.    Today, pt c/o R leg pain x 1 month.  Patient locates pain to medial/posterior knee upper leg, and lower leg. Sx started started after her grandson walked in front of his causing her knee to hyperextend. Sx worse with sit-to-stand. Also notes pain radiating into the lateral hip/thigh. Ambulating with a limp today. Sx also worse with prolonged time walking. The knee feels unstable. Denies visible swelling.   Radiates: R LE Aggravates: WB, sit-to-stand, ambulation Treatments tried: heat, Tylenol  Dx testing: 03/18/22 L-spine MRI 01/31/22 L-spine XR 08/05/21 LE vasc US             08/04/20 Labs             04/28/20 LE vasc US reflux  Pertinent review of systems: No fevers or chills  Relevant historical information: Peripheral vascular disease and hypertension.   Exam:  BP 120/78   Pulse (!) 59   Ht 5\' 6"  (1.676 m)   Wt (!) 314 lb (142.4 kg)   SpO2 96%   BMI 50.68 kg/m  General: Well Developed, well nourished, and in no acute distress.   MSK: Right knee moderate effusion normal-appearing otherwise. Normal motion with crepitation.  Tender palpation medial joint line. Stable ligamentous exam. Intact strength. Antalgic gait.    Lab and Radiology Results  Procedure: Real-time Ultrasound Guided Injection of right knee joint superior lateral patella space Device: Philips Affiniti 50G/GE Logiq Images permanently stored and available for review in PACS Verbal informed consent obtained.  Discussed risks and benefits of procedure. Warned about infection, bleeding, hyperglycemia damage to structures among others. Patient expresses understanding and agreement Time-out conducted.   Noted  no overlying erythema, induration, or other signs of local infection.   Skin prepped in a sterile fashion.   Local anesthesia: Topical Ethyl chloride.   With sterile technique and under real time ultrasound guidance: 40 mg of Kenalog and 2 mL of Marcaine injected into knee joint. Fluid seen entering the joint capsule.   Completed without difficulty   Pain immediately resolved suggesting accurate placement of the medication.   Advised to call if fevers/chills, erythema, induration, drainage, or persistent bleeding.   Images permanently stored and available for review in the ultrasound unit.  Impression: Technically successful ultrasound guided injection. Spinal needle used  X-ray images right knee obtained today personally and independently interpreted. Moderate medial DJD and moderate patellofemoral DJD.  No acute fractures are visible. Await formal radiology review    Assessment and Plan: 68 y.o. female with exacerbation of chronic right knee pain.  Patient does have DJD and I think likely had a degenerative meniscus tear to explain her current flareup.  Plan for steroid injection.   PDMP not reviewed this encounter. Orders Placed This Encounter  Procedures   [place] LIMITED JOINT SPACE STRUCTURES LOW RIGHT(NO LINKED CHARGES)    Reason for Exam (SYMPTOM  OR DIAGNOSIS REQUIRED):   right knee pain    Preferred imaging location?:    [facility] Knee AP/LAT W/Sunrise Right    Standing Status:   Future    Number of Occurrences:   1    Expiration Date:   03/17/2023  Reason for Exam (SYMPTOM  OR DIAGNOSIS REQUIRED):   right knee injury    Preferred imaging location?:   [facility]   No orders of the defined types were placed in this encounter.    Discussed warning signs or symptoms. Please see discharge instructions. Patient expresses understanding.   The above documentation has been reviewed and is accurate and complete Clementeen Graham, M.D.

## 2023-02-14 NOTE — Patient Instructions (Addendum)
Thank you for coming in today.  You received an injection today. Seek immediate medical attention if the joint becomes red, extremely painful, or is oozing fluid.  Please get an Xray today before you leave  

## 2023-02-25 ENCOUNTER — Encounter: Payer: Self-pay | Admitting: Family Medicine

## 2023-02-25 NOTE — Progress Notes (Signed)
Right knee x-ray shows mild arthritis changes

## 2023-02-26 ENCOUNTER — Ambulatory Visit (INDEPENDENT_AMBULATORY_CARE_PROVIDER_SITE_OTHER): Payer: Commercial Managed Care - PPO | Admitting: Internal Medicine

## 2023-02-26 ENCOUNTER — Encounter: Payer: Self-pay | Admitting: Internal Medicine

## 2023-02-26 ENCOUNTER — Ambulatory Visit: Payer: Self-pay | Admitting: Nurse Practitioner

## 2023-02-26 VITALS — BP 126/82 | HR 55 | Temp 97.6°F | Ht 66.0 in | Wt 313.0 lb

## 2023-02-26 DIAGNOSIS — R001 Bradycardia, unspecified: Secondary | ICD-10-CM

## 2023-02-26 DIAGNOSIS — I10 Essential (primary) hypertension: Secondary | ICD-10-CM

## 2023-02-26 DIAGNOSIS — E119 Type 2 diabetes mellitus without complications: Secondary | ICD-10-CM | POA: Insufficient documentation

## 2023-02-26 DIAGNOSIS — M7989 Other specified soft tissue disorders: Secondary | ICD-10-CM

## 2023-02-26 DIAGNOSIS — E1159 Type 2 diabetes mellitus with other circulatory complications: Secondary | ICD-10-CM | POA: Diagnosis not present

## 2023-02-26 DIAGNOSIS — M79661 Pain in right lower leg: Secondary | ICD-10-CM

## 2023-02-26 DIAGNOSIS — E1169 Type 2 diabetes mellitus with other specified complication: Secondary | ICD-10-CM | POA: Insufficient documentation

## 2023-02-26 DIAGNOSIS — M170 Bilateral primary osteoarthritis of knee: Secondary | ICD-10-CM | POA: Diagnosis not present

## 2023-02-26 DIAGNOSIS — R9431 Abnormal electrocardiogram [ECG] [EKG]: Secondary | ICD-10-CM

## 2023-02-26 DIAGNOSIS — R0609 Other forms of dyspnea: Secondary | ICD-10-CM

## 2023-02-26 LAB — MICROALBUMIN / CREATININE URINE RATIO
Creatinine,U: 150.9 mg/dL
Microalb Creat Ratio: 0.5 mg/g (ref 0.0–30.0)
Microalb, Ur: <0.7 mg/dL (ref 0.0–1.9)

## 2023-02-26 LAB — D-DIMER, QUANTITATIVE: D-Dimer, Quant: 0.28 ug{FEU}/mL (ref <0.50)

## 2023-02-26 LAB — BRAIN NATRIURETIC PEPTIDE: Pro B Natriuretic peptide (BNP): 85 pg/mL (ref 0.0–100.0)

## 2023-02-26 LAB — HEMOGLOBIN A1C: Hgb A1c MFr Bld: 6.4 % (ref 4.6–6.5)

## 2023-02-26 LAB — TROPONIN I (HIGH SENSITIVITY): High Sens Troponin I: 7 ng/L (ref 2–17)

## 2023-02-26 MED ORDER — CARVEDILOL 6.25 MG PO TABS: 6.2500 mg | ORAL_TABLET | Freq: Two times a day (BID) | ORAL | 0 refills | Status: DC

## 2023-02-26 MED ORDER — TRAMADOL HCL ER 100 MG PO TB24: 100.0000 mg | ORAL_TABLET | Freq: Every day | ORAL | 0 refills | Status: DC | PRN

## 2023-02-26 NOTE — Patient Instructions (Signed)

## 2023-02-26 NOTE — Telephone Encounter (Signed)
 Summary: Right Leg Pain   Copied From CRM [phone]. Reason for Triage: Patient is still having pain in her right leg, wanted to make an appointment but decision tree stopped me. Patient seen Dr. Joane for leg pain 02/14/23 - says she can't walk today          Chief Complaint: leg pain Symptoms: right leg pain, behind the knee Frequency: constant Pertinent Negatives: Patient denies shortness of breath Disposition: [] ED /[] Urgent Care (no appt availability in office) / [x] Appointment(In office/virtual)/ []  [facility]/ [] Home Care/ [] Refused Recommended Disposition /[] [facility]/ []  Follow-up with PCP Additional Notes: Patient reports RIGHT leg pain, behind her knee. States pain is pretty constant, she was seen on 01/23 and got an injection in her knee. She states she felt better afterwards but 2 days ago she stepped off a high curb at work pretty hard. She did not fall, but the impact of the step aggravate her leg again. She states after sitting for over an hour at home, she had trouble moving  her leg due to stiffness and pain.     Reason for Disposition  [1] MODERATE pain (e.g., interferes with normal activities, limping) AND [2] present > 3 days  Answer Assessment - Initial Assessment Questions 1. ONSET: When did the pain start?      Started 3 weeks ago  2. LOCATION: Where is the pain located?      Right leg, behind knee.   3. PAIN: How bad is the pain?    (Scale 1-10; or mild, moderate, severe)   -  MILD (1-3): doesn't interfere with normal activities    -  MODERATE (4-7): interferes with normal activities (e.g., work or school) or awakens from sleep, limping    -  SEVERE (8-10): excruciating pain, unable to do any normal activities, unable to walk     7/10 pain  4. WORK OR EXERCISE: Has there been any recent work or exercise that involved this part of the body?      Almost fell at work stepping off curbl; stepped down too hard  5. CAUSE: What do  you think is causing the leg pain?-Possibly reinjured at work     6. OTHER SYMPTOMS: Do you have any other symptoms? (e.g., chest pain, back pain, breathing difficulty, swelling, rash, fever, numbness, weakness)     Numbness in foot; appears to be a little swollen  7. PREGNANCY: Is there any chance you are pregnant? When was your last menstrual period?     No  Protocols used: Leg Pain-A-AH

## 2023-02-26 NOTE — Progress Notes (Signed)
 Subjective:  Patient ID: Kaylee Carpenter, female    DOB: 09-Apr-1955  Age: 68 y.o. MRN: [id number]  CC: Osteoarthritis and Diabetes   HPI Kaylee Carpenter presents for f/up ---  Discussed the use of AI scribe software for clinical note transcription with the patient, who gave verbal consent to proceed.  History of Present Illness   Kaylee Carpenter is a 68 year old female who presents with right lower leg pain and swelling.  She has been experiencing right lower leg pain and swelling for the past two years, which she attributes to a fall that occurred at that time. The pain is localized to the right lower leg and does not involve the knee. The swelling is significant and has been persistent since the fall.  Approximately two to three weeks ago, she received an injection in her right knee, which alleviated any knee pain she might have had. However, the pain and swelling in her lower leg persist.       Outpatient Medications Prior to Visit  Medication Sig Dispense Refill   acetaminophen  (TYLENOL ) 500 MG tablet Take 500-1,000 mg by mouth every 8 (eight) hours as needed.     aspirin 81 MG tablet Take 81 mg by mouth daily.     atorvastatin  (LIPITOR) 10 MG tablet TAKE 1 TABLET(10 MG) BY MOUTH DAILY 90 tablet 0   cholecalciferol (VITAMIN D ) 1000 units tablet Take 2,000 Units by mouth daily.     ibuprofen  (ADVIL ) 400 MG tablet Take 400 mg by mouth as needed.     mupirocin  ointment (BACTROBAN ) 2 % APPLY TOPICALLY TO THE AFFECTED AREA TWICE DAILY 22 g 0   spironolactone  (ALDACTONE ) 25 MG tablet Take 1 tablet (25 mg total) by mouth daily. 90 tablet 1   torsemide  (DEMADEX ) 20 MG tablet TAKE 1 TABLET(20 MG) BY MOUTH DAILY 90 tablet 0   carvedilol  (COREG ) 12.5 MG tablet Take 1 tablet (12.5 mg total) by mouth 2 (two) times daily with a meal. Annual appt due in Aug must see provider for future refills 180 tablet 3   No facility-administered medications prior to visit.    ROS Review of  Systems  Constitutional: Negative.  Negative for chills, diaphoresis, fatigue and fever.  HENT: Negative.    Eyes:  Negative for visual disturbance.  Respiratory:  Positive for shortness of breath (DOE). Negative for cough, chest tightness and wheezing.   Cardiovascular:  Negative for chest pain, palpitations and leg swelling.  Gastrointestinal:  Negative for abdominal pain, constipation, diarrhea, nausea and vomiting.  Endocrine: Negative.   Genitourinary: Negative.  Negative for difficulty urinating.  Musculoskeletal:  Positive for arthralgias. Negative for joint swelling.  Skin:  Positive for color change.  Neurological: Negative.  Negative for weakness.  Hematological:  Negative for adenopathy. Does not bruise/bleed easily.  Psychiatric/Behavioral:  Positive for confusion and decreased concentration.     Objective:  BP 126/82 (BP Location: Left Arm, Patient Position: Sitting, Cuff Size: Large)   Pulse (!) 55   Temp 97.6 F (36.4 C) (Oral)   Ht 5' 6 (1.676 m)   Wt (!) 313 lb (142 kg)   SpO2 99%   BMI 50.52 kg/m   BP Readings from Last 3 Encounters:  02/26/23 126/82  02/14/23 120/78  11/09/22 112/76    Wt Readings from Last 3 Encounters:  02/26/23 (!) 313 lb (142 kg)  02/14/23 (!) 314 lb (142.4 kg)  11/09/22 (!) 311 lb 6 oz (141.2 kg)    Physical Exam  Vitals reviewed.  Constitutional:      General: She is not in acute distress.    Appearance: She is obese. She is not ill-appearing, toxic-appearing or diaphoretic.  HENT:     Nose: Nose normal.     Mouth/Throat:     Mouth: Mucous membranes are moist.  Eyes:     General: No scleral icterus.    Conjunctiva/sclera: Conjunctivae normal.  Cardiovascular:     Rate and Rhythm: Regular rhythm. Bradycardia present.     Heart sounds: No murmur heard.    No gallop.     Comments: EKG--- SB, 51 bpm Increased R/S ratio is old No LVH, Q waves, or ST/T wave changes  Unchanged Pulmonary:     Effort: Pulmonary effort is  normal.     Breath sounds: No stridor. No wheezing, rhonchi or rales.  Abdominal:     General: Abdomen is protuberant. Bowel sounds are normal. There is no distension.     Palpations: Abdomen is soft. There is no hepatomegaly, splenomegaly or mass.     Tenderness: There is no abdominal tenderness.  Musculoskeletal:        General: Deformity (DJD) present. Normal range of motion.     Cervical back: Neck supple.     Right lower leg: No edema.     Left lower leg: No edema.  Lymphadenopathy:     Cervical: No cervical adenopathy.  Skin:    Findings: No rash.  Neurological:     General: No focal deficit present.     Mental Status: She is alert. Mental status is at baseline.  Psychiatric:        Mood and Affect: Mood normal.        Behavior: Behavior normal.     Lab Results  Component Value Date   WBC 5.1 10/25/2022   HGB 12.7 10/25/2022   HCT 39.8 10/25/2022   PLT 280.0 10/25/2022   GLUCOSE 114 (H) 11/09/2022   CHOL 133 09/27/2022   TRIG 87.0 09/27/2022   HDL 55.20 09/27/2022   LDLCALC 60 09/27/2022   ALT 12 10/25/2022   AST 15 10/25/2022   NA 139 11/09/2022   K 3.9 11/09/2022   CL 100 11/09/2022   CREATININE 1.01 11/09/2022   BUN 16 11/09/2022   CO2 31 11/09/2022   TSH 1.28 10/25/2022   HGBA1C 6.4 02/26/2023   MICROALBUR <0.7 02/26/2023    DG Knee AP/LAT W/Sunrise Right Result Date: 02/22/2023 CLINICAL DATA:  right knee injury EXAM: RIGHT KNEE 3 VIEWS COMPARISON:  None Available. FINDINGS: Osteopenia. No acute fracture or dislocation. Enthesopathic changes of the quadriceps tendon. Subcortical cystic change and mild osteophyte proliferation patellofemoral joint. Mild tricompartmental joint space narrowing. No area of erosion or osseous destruction. No unexpected radiopaque foreign body. Soft tissues are unremarkable. IMPRESSION: 1. No acute fracture or dislocation. 2. Mild degenerative changes of the knee. Electronically Signed   By: Corean Salter M.D.   On:  02/22/2023 07:33     Assessment & Plan:  Type 2 diabetes mellitus with other circulatory complication, without long-term current use of insulin  (HCC)- Her blood sugar is well controlled. -     Hemoglobin A1c; Future -     Microalbumin / creatinine urine ratio; Future -     Urinalysis, Routine w reflex microscopic; Future  Pain and swelling of lower leg, right -     D-dimer, quantitative; Future  Primary osteoarthritis of both knees -     traMADol  HCl ER; Take 1 tablet (100 mg  total) by mouth daily as needed for pain.  Dispense: 90 tablet; Refill: 0  Bradycardia- Will lower the BB dose. -     EKG 12-Lead  Primary hypertension- Her BP is well controlled. -     Carvedilol ; Take 1 tablet (6.25 mg total) by mouth 2 (two) times daily with a meal.  Dispense: 180 tablet; Refill: 0 -     AMB Referral VBCI Care Management  DOE (dyspnea on exertion) -     Troponin I (High Sensitivity); Future -     Brain natriuretic peptide; Future -     CT CORONARY MORPH W/CTA COR W/SCORE W/CA W/CM &/OR WO/CM; Future  Abnormal electrocardiogram (ECG) (EKG) -     CT CORONARY MORPH W/CTA COR W/SCORE W/CA W/CM &/OR WO/CM; Future     Follow-up: Return in about 3 months (around 05/26/2023).  Debby Molt, MD

## 2023-02-27 ENCOUNTER — Encounter: Payer: Self-pay | Admitting: Internal Medicine

## 2023-02-27 ENCOUNTER — Telehealth: Payer: Self-pay

## 2023-02-27 LAB — URINALYSIS, ROUTINE W REFLEX MICROSCOPIC
Bilirubin Urine: NEGATIVE
Hgb urine dipstick: NEGATIVE
Ketones, ur: NEGATIVE
Leukocytes,Ua: NEGATIVE
Nitrite: NEGATIVE
Specific Gravity, Urine: 1.015 (ref 1.000–1.030)
Total Protein, Urine: NEGATIVE
Urine Glucose: NEGATIVE
Urobilinogen, UA: 0.2 (ref 0.0–1.0)
pH: 6.5 (ref 5.0–8.0)

## 2023-02-27 NOTE — Progress Notes (Signed)
 Care Guide Pharmacy Note  02/27/2023 Name: Kaylee Carpenter MRN: [id number] DOB: Jun 16, 1955  Referred By: Elnor Lauraine BRAVO, NP Reason for referral: Care Coordination (Outreach to schedule with Pharm d )   Kaylee Carpenter is a 68 y.o. year old female who is a primary care patient of Elnor Lauraine BRAVO, NP.  Kaylee Carpenter was referred to the pharmacist for assistance related to: HTN  An unsuccessful telephone outreach was attempted today to contact the patient who was referred to the pharmacy team for assistance with medication management. Additional attempts will be made to contact the patient.  Jeoffrey Buffalo , RMA     [facility]  [facility], [facility] Guide  Direct Dial: [phone]  Website: [email or web address]

## 2023-03-01 ENCOUNTER — Other Ambulatory Visit (HOSPITAL_COMMUNITY): Payer: Self-pay

## 2023-03-01 ENCOUNTER — Telehealth: Payer: Self-pay

## 2023-03-01 NOTE — Telephone Encounter (Signed)
 Pharmacy Patient Advocate Encounter   Received notification from  [facility] Portal that prior authorization for traMADol  HCl ER 100MG  er tablets is required/requested.   Insurance verification completed.   The patient is insured through [facility] .   Per test claim: PA required; PA submitted to above mentioned insurance via CoverMyMeds Key/confirmation #/EOC [facility] Status is pending

## 2023-03-04 NOTE — Telephone Encounter (Signed)
 Pharmacy Patient Advocate Encounter  Received notification from [employer] that Prior Authorization for Tramadol  Hcl Tab 100mg  Er has been DENIED.  See denial reason below. No denial letter attached in CMM. Will attach denial letter to Media tab once received.   PA #/Case ID/Reference #: [id number].

## 2023-03-06 NOTE — Progress Notes (Signed)
Care Guide Pharmacy Note  03/06/2023 Name: Kaylee Carpenter MRN: [id number] DOB: [birth year]  Referred By: Elenore Paddy, NP Reason for referral: Care Coordination (Outreach to schedule with Pharm d )   Kaylee Carpenter is a 68 y.o. year old female who is a primary care patient of Elenore Paddy, NP.  Kaylee Carpenter was referred to the pharmacist for assistance related to: HTN  A second unsuccessful telephone outreach was attempted today to contact the patient who was referred to the pharmacy team for assistance with medication management. Additional attempts will be made to contact the patient.  Penne Lash , RMA     [facility]  [facility], [facility] Guide  Direct Dial: [phone]  Website: [email or web address]

## 2023-03-08 ENCOUNTER — Ambulatory Visit: Payer: Self-pay | Admitting: Nurse Practitioner

## 2023-03-08 ENCOUNTER — Telehealth: Payer: Self-pay | Admitting: Nurse Practitioner

## 2023-03-08 ENCOUNTER — Ambulatory Visit: Payer: Medicare Other | Admitting: Adult Health

## 2023-03-08 NOTE — Telephone Encounter (Signed)
Called pt and left vm to r/s with PCP

## 2023-03-08 NOTE — Telephone Encounter (Signed)
Copied from CRM [id number]. Topic: Clinical - Red Word Triage >> Mar 08, 2023  3:44 PM Theodis Sato wrote: Red Word that prompted transfer to Nurse Triage: Patient is still have severe leg pain and swelling, triage nurse scheduled her at [facility] . [facility] denied to see patient.   Chief Complaint: Right leg pain and swelling Symptoms: swelling and pain Frequency: since Christmas Pertinent Negatives: Patient denies redness, history of blood clots, chest pain, difficulty breathing, fever Disposition: [] ED /[x] Urgent Care (no appt availability in office) / [] Appointment(In office/virtual)/ []  [place] [facility]/ [] Home Care/ [] Refused Recommended Disposition /[] [place] [facility]/ []  Follow-up with PCP Additional Notes: Patient called and advised that she went to the [facility] location and they refused to see her today.  She initially called this morning and made an appointment for this afternoon at the [facility] location.  She is normally a patient at the [facility] location. Patient states she had an injury two years ago with this leg.  She would have flare ups every so often.  Dr Denyse Amass gave patient an injection in January for this right knee.  Dr Sanda Linger then saw the patient and he had prescribed Tramadol but the pharmacy stated that they needed more paperwork for authorization first.  Patient wanted to let her PCP know this. Patient denies any redness, history of blood clots, recent injuries, chest pain, difficulty breathing, or fever. Patient is advised that there are no appointments in the PCP office for today and the recommendation is that she be seen in the next 4 hours for further evaluation.  Patient is also advised that if anything worsens to go to the emergency room.  Patient verbalized understanding.  Reason for Disposition  [1] SEVERE pain (e.g., excruciating, unable to walk) AND [2] not improved after 2 hours of pain medicine  Answer Assessment - Initial Assessment  Questions 1. ONSET: "When did the swelling start?" (e.g., minutes, hours, days)     Around Christmas time 2. LOCATION: "What part of the leg is swollen?"  "Are both legs swollen or just one leg?"     Behind the right knee 3. SEVERITY: "How bad is the swelling?" (e.g., localized; mild, moderate, severe)   - Localized: Small area of swelling localized to one leg.   - MILD pedal edema: Swelling limited to foot and ankle, pitting edema < 1/4 inch (6 mm) deep, rest and elevation eliminate most or all swelling.   - MODERATE edema: Swelling of lower leg to knee, pitting edema > 1/4 inch (6 mm) deep, rest and elevation only partially reduce swelling.   - SEVERE edema: Swelling extends above knee, facial or hand swelling present.      "Pretty bad" 4. REDNESS: "Does the swelling look red or infected?"     No red and no infection 5. PAIN: "Is the swelling painful to touch?" If Yes, ask: "How painful is it?"   (Scale 1-10; mild, moderate or severe)     8 6. FEVER: "Do you have a fever?" If Yes, ask: "What is it, how was it measured, and when did it start?"      no 7. CAUSE: "What do you think is causing the leg swelling?"     unknown 8. MEDICAL HISTORY: "Do you have a history of blood clots (e.g., DVT), cancer, heart failure, kidney disease, or liver failure?"     no 9. RECURRENT SYMPTOM: "Have you had leg swelling before?" If Yes, ask: "When was the last time?" "What happened that  time?"     Yes--injection for the pain in a previous visit. 10. OTHER SYMPTOMS: "Do you have any other symptoms?" (e.g., chest pain, difficulty breathing)       no  Answer Assessment - Initial Assessment Questions 1. LOCATION: "Where is the swelling located?"  (e.g., left, right, both knees)     Behind right knee 2. ONSET: "When did the swelling start?" "Does it come and go, or is it there all the time?"     Christmas 3. SWELLING: "How bad is the swelling?" Or, "How large is it?" (e.g., mild, moderate, severe; size of  localized swelling)    - NONE: No joint swelling.   - LOCALIZED: Localized; small area of puffy or swollen skin (e.g., insect bite, skin irritation).   - MILD: Joint looks or feels mildly swollen or puffy.   - MODERATE: Swollen; interferes with normal activities (e.g., work or school); can't move joint normally (bend and straighten completely); may be limping.   - SEVERE: Very swollen; can't move swollen joint at all; limping a lot or unable to walk.     "Pretty swollen" 4. PAIN: "Is there any pain?" If Yes, ask: "How bad is it?" (Scale 1-10; or mild, moderate, severe)   - NONE (0): no pain.   - MILD (1-3): doesn't interfere with normal activities.    - MODERATE (4-7): interferes with normal activities (e.g., work or school) or awakens from sleep, limping.    - SEVERE (8-10): excruciating pain, unable to do any normal activities, unable to walk.      8 5. SETTING: "Has there been any recent work, exercise or other activity that involved that part of the body?"      na 6. AGGRAVATING FACTORS: "What makes the knee swelling worse?" (e.g., walking, climbing stairs, running)     na 7. ASSOCIATED SYMPTOMS: "Is there any pain or redness?"     Pain but no redness 8. OTHER SYMPTOMS: "Do you have any other symptoms?" (e.g., chest pain, difficulty breathing, fever, calf pain)     no  Protocols used: Leg Swelling and Edema-A-AH, Knee Swelling-A-AH

## 2023-03-08 NOTE — Telephone Encounter (Signed)
Copied from CRM [id number]. Topic: Clinical - Red Word Triage >> Mar 08, 2023  8:07 AM Kaylee Carpenter wrote: Red Word that prompted transfer to Nurse Triage: Right leg in dire pain and swollen pain is an 8/10. Wants to come in for another appointment. Has came in before for this issue and nothing has helped so far.   Chief Complaint: Leg pain  Symptoms: R leg pain swelling Frequency: Ongoing issue since around Christmas but is worsening now Disposition: [] ED /[] Urgent Care (no appt availability in office) / [x] Appointment(In office/virtual)/ []  [place] [facility]/ [] Home Care/ [] Refused Recommended Disposition /[] [place] [facility]/ []  Follow-up with PCP Additional Notes: Patient stated that she has been dealing with leg pain and swelling since around Christmas. She stated she has been seen for the problem before, but she does not know the cause of her symptoms and symptoms have not resolved. She stated she was given a prescription for Tramadol, but she was never able to get it from the pharmacy. She stated the office was suppose to do something on their end to approve it but they never did. She feels like her symptoms are getting a little worse now. No appt available at PCP office today. Appointment scheduled for this afternoon at different location.     Reason for Disposition  [1] MODERATE pain (e.g., interferes with normal activities, limping) AND [2] present > 3 days  Answer Assessment - Initial Assessment Questions 1. ONSET: "When did the pain start?"      After Christmas  2. LOCATION: "Where is the pain located?"      R leg  3. PAIN: "How bad is the pain?"    (Scale 1-10; or mild, moderate, severe)   -  MILD (1-3): doesn't interfere with normal activities    -  MODERATE (4-7): interferes with normal activities (e.g., work or school) or awakens from sleep, limping    -  SEVERE (8-10): excruciating pain, unable to do any normal activities, unable to walk     8/10  4. CAUSE: "What  do you think is causing the leg pain?"     Unknown   5. OTHER SYMPTOMS: "Do you have any other symptoms?" (e.g., chest pain, back pain, breathing difficulty, swelling, rash, fever, numbness, weakness)    Leg swelling (around the knee), Numbness in toes (not new), hurts to walk and causes her to limp  Protocols used: Leg Pain-A-AH

## 2023-03-08 NOTE — Telephone Encounter (Signed)
Called pt and left vm to r/s her to see her PCP Jiles Prows today. Per provider pt needs to f/u with her PCP and cannot see another provider at a different office.

## 2023-03-11 ENCOUNTER — Encounter: Payer: Self-pay | Admitting: Family Medicine

## 2023-03-11 ENCOUNTER — Ambulatory Visit (INDEPENDENT_AMBULATORY_CARE_PROVIDER_SITE_OTHER): Payer: Commercial Managed Care - PPO | Admitting: Family Medicine

## 2023-03-11 VITALS — BP 136/82 | HR 74 | Temp 97.8°F | Ht 66.0 in | Wt 309.0 lb

## 2023-03-11 DIAGNOSIS — M79604 Pain in right leg: Secondary | ICD-10-CM | POA: Diagnosis not present

## 2023-03-11 DIAGNOSIS — M1711 Unilateral primary osteoarthritis, right knee: Secondary | ICD-10-CM

## 2023-03-11 DIAGNOSIS — H60393 Other infective otitis externa, bilateral: Secondary | ICD-10-CM | POA: Diagnosis not present

## 2023-03-11 MED ORDER — TRAMADOL HCL 50 MG PO TABS: 50.0000 mg | ORAL_TABLET | Freq: Three times a day (TID) | ORAL | 0 refills | Status: AC | PRN

## 2023-03-11 MED ORDER — CORTISPORIN-TC 3.3-3-10-0.5 MG/ML OT SUSP: 3.0000 [drp] | Freq: Four times a day (QID) | OTIC | 0 refills | Status: DC

## 2023-03-11 MED ORDER — LIDOCAINE 4 % EX PTCH: 1.0000 | MEDICATED_PATCH | CUTANEOUS | 1 refills | Status: DC

## 2023-03-11 MED ORDER — DICLOFENAC SODIUM 1 % EX GEL: 4.0000 g | Freq: Four times a day (QID) | CUTANEOUS | 1 refills | Status: AC

## 2023-03-11 NOTE — Patient Instructions (Signed)
 I have sent in Voltaren gel for you to use topically to your knee up to 4 times per day as needed for pain.  I have sent in tramadol for you to take one tablet as needed for breakthrough pain.  I have sent in lidocaine patches for you to apply to the affected area, one patch every 24 hours.  I have ordered an MRI of your Right knee.  I have placed a referral to orthopedics for further evaluation and follow up.   Someone will be reaching out to you to get these appointments scheduled.   Follow-up with me for new or worsening symptoms.

## 2023-03-11 NOTE — Progress Notes (Signed)
 Care Guide Pharmacy Note  03/11/2023 Name: Kaylee Carpenter MRN: [id number] DOB: 11/12/1955  Referred By: Elenore Paddy, NP Reason for referral: Care Coordination (Outreach to schedule with Pharm d )   Kaylee Carpenter is a 68 y.o. year old female who is a primary care patient of Elenore Paddy, NP.  Kaylee Carpenter was referred to the pharmacist for assistance related to: HTN  Successful contact was made with the patient to discuss pharmacy services including being ready for the pharmacist to call at least 5 minutes before the scheduled appointment time and to have medication bottles and any blood pressure readings ready for review. The patient agreed to meet with the pharmacist via telephone visit on (date/time).03/22/2023  Penne Lash , RMA     [place], [facility] Guide  Direct Dial: [phone]  Website: [email or web address]

## 2023-03-11 NOTE — Progress Notes (Signed)
 Acute Office Visit  Subjective:     Patient ID: Kaylee Carpenter, female    DOB: 08-20-1955, 68 y.o.   MRN: [id number]  Chief Complaint  Patient presents with   Leg Pain    Right leg pain, since christmas. Has been seen 3 other times this year for this pain    HPI Patient is in today for right leg pain. Taking meloxicam that she received from urgent care with no relief over the weekend. Has seen Dr Denyse Amass with sports med with steroid injection to the area with little relief.  Has been taking tylenol, ibuprofen, then meloxicam for pain.  Pain is worse with ambulation.  Has had xray, shows OA. Has not had MRI.  Reports right ear canal itching for the last few days.  Has used OTC drops with little relief. Denies pain, discharge, bleeding, other symptoms.  ROS Per HPI      Objective:    BP 136/82 (BP Location: Left Arm, Patient Position: Sitting, Cuff Size: Large)   Pulse 74   Temp 97.8 F (36.6 C)   Ht 5\' 6"  (1.676 m)   Wt (!) 309 lb (140.2 kg)   SpO2 100%   BMI 49.87 kg/m    Physical Exam Vitals and nursing note reviewed.  Constitutional:      General: She is not in acute distress.    Appearance: Normal appearance. She is obese.  HENT:     Head: Normocephalic and atraumatic.     Right Ear: Tympanic membrane normal.     Left Ear: Tympanic membrane normal.     Ears:     Comments: Bilateral ear canals with yellow flaking skin, mild erythema, mild swelling, non tender, no discharge, no bleeding    Nose: Nose normal.  Eyes:     Extraocular Movements: Extraocular movements intact.     Pupils: Pupils are equal, round, and reactive to light.  Pulmonary:     Effort: Pulmonary effort is normal.  Musculoskeletal:        General: Swelling and tenderness present.     Cervical back: Normal range of motion.     Comments: Swelling and tenderness to medial inferior aspect of R knee. No erythema, no lesions, no heat noted  Lymphadenopathy:     Cervical: No cervical  adenopathy.  Neurological:     General: No focal deficit present.     Mental Status: She is alert and oriented to person, place, and time.  Psychiatric:        Mood and Affect: Mood normal.        Thought Content: Thought content normal.    No results found for any visits on 03/11/23.      Assessment & Plan:  1. Right leg pain (Primary)  - traMADol (ULTRAM) 50 MG tablet; Take 1 tablet (50 mg total) by mouth every 8 (eight) hours as needed for up to 5 days.  Dispense: 15 tablet; Refill: 0 - diclofenac Sodium (VOLTAREN ARTHRITIS PAIN) 1 % GEL; Apply 4 g topically 4 (four) times daily.  Dispense: 150 g; Refill: 1 - lidocaine 4 %; Place 1 patch onto the skin daily.  Dispense: 30 patch; Refill: 1 - MR KNEE RIGHT WO CONTRAST; Future - Ambulatory referral to Orthopedic Surgery  2. Primary osteoarthritis of right knee  - traMADol (ULTRAM) 50 MG tablet; Take 1 tablet (50 mg total) by mouth every 8 (eight) hours as needed for up to 5 days.  Dispense: 15 tablet; Refill: 0 - diclofenac  Sodium (VOLTAREN ARTHRITIS PAIN) 1 % GEL; Apply 4 g topically 4 (four) times daily.  Dispense: 150 g; Refill: 1 - lidocaine 4 %; Place 1 patch onto the skin daily.  Dispense: 30 patch; Refill: 1 - MR KNEE RIGHT WO CONTRAST; Future - Ambulatory referral to Orthopedic Surgery  3. Other infective acute otitis externa of both ears  - neomycin-colistin-hydrocortisone-thonzonium (CORTISPORIN-TC) 3.03-24-08-0.5 MG/ML OTIC suspension; Place 3 drops into both ears 4 (four) times daily.  Dispense: 10 mL; Refill: 0   Meds ordered this encounter  Medications   neomycin-colistin-hydrocortisone-thonzonium (CORTISPORIN-TC) 3.03-24-08-0.5 MG/ML OTIC suspension    Sig: Place 3 drops into both ears 4 (four) times daily.    Dispense:  10 mL    Refill:  0   traMADol (ULTRAM) 50 MG tablet    Sig: Take 1 tablet (50 mg total) by mouth every 8 (eight) hours as needed for up to 5 days.    Dispense:  15 tablet    Refill:  0    diclofenac Sodium (VOLTAREN ARTHRITIS PAIN) 1 % GEL    Sig: Apply 4 g topically 4 (four) times daily.    Dispense:  150 g    Refill:  1   lidocaine 4 %    Sig: Place 1 patch onto the skin daily.    Dispense:  30 patch    Refill:  1    Return if symptoms worsen or fail to improve.  Moshe Cipro, FNP

## 2023-03-12 ENCOUNTER — Telehealth: Payer: Self-pay | Admitting: Nurse Practitioner

## 2023-03-12 ENCOUNTER — Encounter: Payer: Self-pay | Admitting: Family Medicine

## 2023-03-12 ENCOUNTER — Other Ambulatory Visit: Payer: Self-pay | Admitting: Family Medicine

## 2023-03-12 NOTE — Telephone Encounter (Signed)
 Copied from CRM [id number]. Topic: General - Other >> Mar 12, 2023  9:20 AM Irine Seal wrote: Reason for CRM: Helmut Muster with [facility] [facility] - calling in regards to MR KNEE RIGHT WO CONTRAST (Order [id number]) patient is scheduled for 03/14/2023, she was calling to get the authorization number and the validity date since [facility] did the prior authorization, she asked that it be faxed to, [phone]

## 2023-03-12 NOTE — Telephone Encounter (Signed)
 Copied from CRM [id number]. Topic: General - Other >> Mar 12, 2023 11:51 AM [place] A wrote: Reason for CRM: [facility] called to speak with Prior Authorization Dept Agent called [employer] and was informed that there is not a PA dept at the office but there has been a message sent over

## 2023-03-14 ENCOUNTER — Other Ambulatory Visit: Payer: Medicare Other

## 2023-03-22 ENCOUNTER — Other Ambulatory Visit: Payer: Medicare Other | Admitting: Pharmacist

## 2023-03-22 DIAGNOSIS — H60393 Other infective otitis externa, bilateral: Secondary | ICD-10-CM

## 2023-03-22 MED ORDER — NEOMYCIN-POLYMYXIN-HC 3.5-10000-1 OT SOLN: 4.0000 [drp] | Freq: Four times a day (QID) | OTIC | 0 refills | Status: AC

## 2023-03-22 NOTE — Progress Notes (Signed)
   03/22/2023 Name: Kaylee Carpenter MRN: [id number] DOB: [birth year]  Chief Complaint  Patient presents with   Hypertension   Medication Management    Kaylee Carpenter is a 68 y.o. year old female who presented for a telephone visit.   They were referred to the pharmacist by their PCP for assistance in managing hypertension.    Subjective:  Care Team: Primary Care Provider: Elenore Paddy, NP ; Next Scheduled Visit: 04/04/23   Medication Access/Adherence  Current Pharmacy:  [facility] [phone] - [place], [place] - [address] AT [facility] [address] [place] [address] Phone: [phone] Fax: [phone]  [id number] [place], [place] - [address] AT [facility] [address] [place] [address] Phone: [phone] Fax: [phone]   Patient reports affordability concerns with their medications: Yes  Patient reports access/transportation concerns to their pharmacy: No  Patient reports adherence concerns with their medications:  Yes    She saw Judeth [facility]field 2/17 and was prescribed ear drops for an ear infection however they were over $200 so she did not get them. Asks if there's an alternative  Hypertension:  Current medications: spironolactone 25 mg daily, carvedilol 6.25 mg twice daily (reduced 02/26/23) Also is on torsemide daily for hx of edema. She notes she has not had swelling in about 2 years  Patient does not have a validated, automated, upper arm home BP cuff Current blood pressure readings: none to report  She notes she feels torsemide dries her skin out. Asks if she needs it.  Objective: BP Readings from Last 3 Encounters:  03/11/23 136/82  02/26/23 126/82  02/14/23 120/78     Lab Results  Component Value Date   HGBA1C 6.4 02/26/2023    Lab Results  Component Value Date   CREATININE 1.01 11/09/2022   BUN 16 11/09/2022   NA 139 11/09/2022   K 3.9 11/09/2022   CL 100  11/09/2022   CO2 31 11/09/2022    Lab Results  Component Value Date   CHOL 133 09/27/2022   HDL 55.20 09/27/2022   LDLCALC 60 09/27/2022   TRIG 87.0 09/27/2022   CHOLHDL 2 09/27/2022    Medications Reviewed Today   Medications were not reviewed in this encounter       Assessment/Plan:   Hypertension: - Currently uncontrolled, BP goal <130/80. BP prior to carvedilol decrease was <130 systolic however last OV it was more elevated.  - Reviewed long term cardiovascular and renal outcomes of uncontrolled blood pressure - Recommend to purchase a arm monitor with large cuff. Recommended to check home blood pressure and heart rate and keep a log and send some readings via mychart - Recommend to continue spironolactone and carvedilol.  - Spoke to Jiles Prows, NP who agreed with trial without torsemide. She has OV in 2 weeks and will assess if edema is present after torsemide d/c.   Ear infection: - Spoke to Moshe Cipro, FNP for an alternative on patient's formula. Judeth [facility] selected neomycin-polymyxin-hydrocortisone otic suspension, Rx sent  Follow Up Plan: f/u after 3/13 PCP f/u  Arbutus Leas, PharmD, BCPS, CPP Clinical Pharmacist Practitioner  Primary Care at [facility] [phone]

## 2023-03-22 NOTE — Patient Instructions (Signed)
 It was a pleasure speaking with you today!  Stop torsemide, monitor for swelling.  We have sent in a different ear antibiotic for you that is hopefully less expensive.  I recommend getting a blood pressure monitor to check blood pressures at home to see if they are elevated after your blood pressure medication change. Send some reading to me via MyChart after you have checked over several days.  Feel free to call with any questions or concerns!  Arbutus Leas, PharmD, BCPS, CPP Clinical Pharmacist Practitioner [facility] [phone]

## 2023-04-01 ENCOUNTER — Telehealth (HOSPITAL_COMMUNITY): Payer: Self-pay | Admitting: *Deleted

## 2023-04-01 NOTE — Telephone Encounter (Signed)
 Attempted to call patient regarding upcoming cardiac CT appointment. Left message on voicemail with name and callback number Johney Frame RN Navigator Cardiac Imaging [facility] [phone] Office

## 2023-04-01 NOTE — Telephone Encounter (Signed)
 Reaching out to patient to offer assistance regarding upcoming cardiac imaging study; pt verbalizes understanding of appt date/time, parking situation and where to check in, pre-test NPO status and medications ordered, and verified current allergies; name and call back number provided for further questions should they arise Johney Frame RN Navigator Cardiac Imaging [facility] [phone] office [phone] cell  Patient BP 133/79, HR 60.

## 2023-04-02 ENCOUNTER — Ambulatory Visit (HOSPITAL_COMMUNITY)
Admission: RE | Admit: 2023-04-02 | Discharge: 2023-04-02 | Disposition: A | Discharge status: Home / Self Care | Payer: Medicare Other | Source: Ambulatory Visit | Attending: Internal Medicine | Admitting: Internal Medicine

## 2023-04-02 ENCOUNTER — Encounter: Payer: Self-pay | Admitting: Internal Medicine

## 2023-04-02 DIAGNOSIS — R9431 Abnormal electrocardiogram [ECG] [EKG]: Secondary | ICD-10-CM | POA: Diagnosis present

## 2023-04-02 DIAGNOSIS — R0609 Other forms of dyspnea: Secondary | ICD-10-CM

## 2023-04-02 MED ORDER — NITROGLYCERIN 0.4 MG SL SUBL
0.8000 mg | SUBLINGUAL_TABLET | Freq: Once | SUBLINGUAL | Status: AC
  Administered 2023-04-02: 0.8 mg via SUBLINGUAL

## 2023-04-02 MED ORDER — NITROGLYCERIN 0.4 MG SL SUBL
SUBLINGUAL_TABLET | SUBLINGUAL | Status: AC
  Filled 2023-04-02: qty 2

## 2023-04-02 MED ORDER — IOHEXOL 350 MG/ML SOLN
100.0000 mL | Freq: Once | INTRAVENOUS | Status: AC | PRN
  Administered 2023-04-02: 100 mL via INTRAVENOUS

## 2023-04-04 ENCOUNTER — Ambulatory Visit (INDEPENDENT_AMBULATORY_CARE_PROVIDER_SITE_OTHER): Payer: Medicare Other | Admitting: Nurse Practitioner

## 2023-04-04 DIAGNOSIS — E785 Hyperlipidemia, unspecified: Secondary | ICD-10-CM | POA: Diagnosis not present

## 2023-04-04 DIAGNOSIS — E66813 Obesity, class 3: Secondary | ICD-10-CM | POA: Diagnosis not present

## 2023-04-04 DIAGNOSIS — M170 Bilateral primary osteoarthritis of knee: Secondary | ICD-10-CM | POA: Diagnosis not present

## 2023-04-04 DIAGNOSIS — Z6841 Body Mass Index (BMI) 40.0 and over, adult: Secondary | ICD-10-CM

## 2023-04-04 LAB — POCT I-STAT CREATININE: Creatinine, Ser: 1.3 mg/dL — ABNORMAL HIGH (ref 0.44–1.00)

## 2023-04-04 NOTE — Assessment & Plan Note (Signed)
 Patient fasting today, check lipid panel, check CMP Further recommendations may be made based upon the results

## 2023-04-04 NOTE — Patient Instructions (Signed)
Low Glycemic Index

## 2023-04-04 NOTE — Assessment & Plan Note (Signed)
 Patient encouraged to follow-up with nutritionist as recommended by orthopedics Encouraged to follow low glycemic index diet Patient may also benefit from pharmacological treatment if we can get coverage approved by insurance company. Patient to follow-up with me in 2 months to see if she has been able to lose weight with lifestyle management alone.

## 2023-04-04 NOTE — Assessment & Plan Note (Signed)
 Incidental finding Collect follow-up labs, further recommendations to be made based upon the results.

## 2023-04-04 NOTE — Progress Notes (Signed)
 Established Patient Office Visit  Subjective   Patient ID: Kaylee Carpenter, female    DOB: 05/30/1955  Age: 68 y.o. MRN: [id number]  Chief Complaint  Patient presents with   hypercalcemia    Right leg/knee pain/Obesity: Has been evaluated by Dewaine Conger and has undergone MRI.  Was told she will need a knee replacement.  Was also told she needs to lose about 50 pounds before surgery can be completed.  She has a phone number to nutritionist to schedule appointment to discuss nutrition to aid in her weight loss journey.  Hypercalcemia: Incidental finding, arrives today for follow-up labs.  HLD: Stopped atorvastatin due to myopathies.  On fish oil currently.  Had calcium CT score completed recently which showed nonischemic amount of plaque.      ROS: see HPI    Objective:     BP 110/80   Pulse 60   Temp 98.3 F (36.8 C) (Temporal)   Ht 5\' 6"  (1.676 m)   Wt (!) 314 lb 8 oz (142.7 kg)   SpO2 99%   BMI 50.76 kg/m  BP Readings from Last 3 Encounters:  04/04/23 110/80  04/02/23 119/60  03/11/23 136/82   Wt Readings from Last 3 Encounters:  04/04/23 (!) 314 lb 8 oz (142.7 kg)  03/11/23 (!) 309 lb (140.2 kg)  02/26/23 (!) 313 lb (142 kg)      Physical Exam Vitals reviewed.  Constitutional:      General: She is not in acute distress.    Appearance: Normal appearance.  HENT:     Head: Normocephalic and atraumatic.  Neck:     Vascular: No carotid bruit.  Cardiovascular:     Rate and Rhythm: Normal rate and regular rhythm.     Pulses: Normal pulses.     Heart sounds: Normal heart sounds.  Pulmonary:     Effort: Pulmonary effort is normal.     Breath sounds: Normal breath sounds.  Skin:    General: Skin is warm and dry.  Neurological:     General: No focal deficit present.     Mental Status: She is alert and oriented to person, place, and time.  Psychiatric:        Mood and Affect: Mood normal.        Behavior: Behavior normal.        Judgment: Judgment  normal.      No results found for any visits on 04/04/23.    The 10-year ASCVD risk score (Arnett DK, et al., 2019) is: 13.4%    Assessment & Plan:   Problem List Items Addressed This Visit       Musculoskeletal and Integument   Primary osteoarthritis of both knees   Chronic Continue follow-up with orthopedics Encouraged patient to follow-up with nutritionist to help with weight loss Encouraged low glycemic index diet Could consider weight loss medications to help if unable to lose weight with lifestyle modification alone Follow-up in 2 months        Other   Obesity   Patient encouraged to follow-up with nutritionist as recommended by orthopedics Encouraged to follow low glycemic index diet Patient may also benefit from pharmacological treatment if we can get coverage approved by insurance company. Patient to follow-up with me in 2 months to see if she has been able to lose weight with lifestyle management alone.      Hyperlipidemia   Patient fasting today, check lipid panel, check CMP Further recommendations may be made based upon the  results      Relevant Orders   Lipid panel   Comprehensive metabolic panel   Hypercalcemia - Primary   Incidental finding Collect follow-up labs, further recommendations to be made based upon the results.      Relevant Orders   VITAMIN D 25 Hydroxy (Vit-D Deficiency, Fractures)   PTH, Intact and Calcium    Return in about 2 months (around 06/04/2023) for F/U with Rafik Koppel.    Elenore Paddy, NP

## 2023-04-04 NOTE — Assessment & Plan Note (Signed)
 Chronic Continue follow-up with orthopedics Encouraged patient to follow-up with nutritionist to help with weight loss Encouraged low glycemic index diet Could consider weight loss medications to help if unable to lose weight with lifestyle modification alone Follow-up in 2 months

## 2023-04-05 ENCOUNTER — Encounter: Payer: Self-pay | Admitting: Nurse Practitioner

## 2023-04-05 ENCOUNTER — Other Ambulatory Visit: Payer: Self-pay | Admitting: Nurse Practitioner

## 2023-04-05 DIAGNOSIS — E559 Vitamin D deficiency, unspecified: Secondary | ICD-10-CM

## 2023-04-05 LAB — COMPREHENSIVE METABOLIC PANEL
ALT: 17 U/L (ref 0–35)
AST: 18 U/L (ref 0–37)
Albumin: 4 g/dL (ref 3.5–5.2)
Alkaline Phosphatase: 88 U/L (ref 39–117)
BUN: 13 mg/dL (ref 6–23)
CO2: 28 meq/L (ref 19–32)
Calcium: 10.3 mg/dL (ref 8.4–10.5)
Chloride: 102 meq/L (ref 96–112)
Creatinine, Ser: 1.02 mg/dL (ref 0.40–1.20)
GFR: 56.11 mL/min — ABNORMAL LOW (ref >60.00)
Glucose, Bld: 87 mg/dL (ref 70–99)
Potassium: 4.3 meq/L (ref 3.5–5.1)
Sodium: 137 meq/L (ref 135–145)
Total Bilirubin: 0.3 mg/dL (ref 0.2–1.2)
Total Protein: 7.6 g/dL (ref 6.0–8.3)

## 2023-04-05 LAB — LIPID PANEL
Cholesterol: 217 mg/dL — ABNORMAL HIGH (ref 0–200)
HDL: 64.4 mg/dL (ref >39.00)
LDL Cholesterol: 128 mg/dL — ABNORMAL HIGH (ref 0–99)
NonHDL: 152.31
Total CHOL/HDL Ratio: 3
Triglycerides: 122 mg/dL (ref 0.0–149.0)
VLDL: 24.4 mg/dL (ref 0.0–40.0)

## 2023-04-05 LAB — PTH, INTACT AND CALCIUM
Calcium: 10.2 mg/dL (ref 8.6–10.4)
PTH: 191 pg/mL — ABNORMAL HIGH (ref 16–77)

## 2023-04-05 LAB — VITAMIN D 25 HYDROXY (VIT D DEFICIENCY, FRACTURES): VITD: 18.21 ng/mL — ABNORMAL LOW (ref 30.00–100.00)

## 2023-04-05 MED ORDER — VITAMIN D (ERGOCALCIFEROL) 1.25 MG (50000 UNIT) PO CAPS: 50000.0000 [IU] | ORAL_CAPSULE | ORAL | 0 refills | Status: DC

## 2023-04-09 ENCOUNTER — Encounter: Payer: Self-pay | Admitting: Family Medicine

## 2023-04-09 ENCOUNTER — Ambulatory Visit (INDEPENDENT_AMBULATORY_CARE_PROVIDER_SITE_OTHER): Admitting: Family Medicine

## 2023-04-09 VITALS — BP 134/79 | HR 82 | Temp 97.9°F | Ht 64.5 in | Wt 311.0 lb

## 2023-04-09 DIAGNOSIS — I10 Essential (primary) hypertension: Secondary | ICD-10-CM | POA: Diagnosis not present

## 2023-04-09 DIAGNOSIS — E785 Hyperlipidemia, unspecified: Secondary | ICD-10-CM | POA: Diagnosis not present

## 2023-04-09 DIAGNOSIS — Z6841 Body Mass Index (BMI) 40.0 and over, adult: Secondary | ICD-10-CM

## 2023-04-09 DIAGNOSIS — G4733 Obstructive sleep apnea (adult) (pediatric): Secondary | ICD-10-CM

## 2023-04-09 DIAGNOSIS — E66813 Obesity, class 3: Secondary | ICD-10-CM | POA: Diagnosis not present

## 2023-04-09 DIAGNOSIS — Z0289 Encounter for other administrative examinations: Secondary | ICD-10-CM

## 2023-04-09 NOTE — Addendum Note (Signed)
 Addended by: Glennis Brink on: 04/09/2023 04:01 PM   Modules accepted: Level of Service

## 2023-04-09 NOTE — Progress Notes (Signed)
 Office: [phone]  /  Fax: [phone]   Initial Visit  Kaylee Carpenter was seen in clinic today to evaluate for obesity. She is interested in losing weight to improve overall health and reduce the risk of weight related complications. She presents today to review program treatment options, initial physical assessment, and evaluation.     She was referred by: Specialist  When asked what else they would like to accomplish? She states: Adopt healthier eating patterns, Improve energy levels and physical activity, Improve existing medical conditions, and Improve quality of life. 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  Sedentary at work.  Lives with grandson-- he will be starting college in the Fall.  Would like to lose down to 225.    Weight history:  started to gain weight after having children.  Pre - baby weight 189 lb.  Weight has been up and down thru the years.  Lost down to 190 lb with thyroid problems in early 2000's.  Regained slowly.  Max 350 lb.  At stable weight x 5 years  When asked how has your weight affected you? She states: Contributed to medical problems, Contributed to orthopedic problems or mobility issues, Having fatigue, and Problems with eating patterns  Some associated conditions: Hypertension, Hyperlipidemia, OSA, and Prediabetes  Contributing factors: Family history of obesity, Reduced physical activity, and Eating patterns  Weight promoting medications identified: None  Current nutrition plan: None  Current level of physical activity: NEAT  Current or previous pharmacotherapy: None  Response to medication: Never tried medications   Past medical history includes:   Past Medical History:  Diagnosis Date   Arthritis    in her back had a shot in march 2024. 08/28/2022   Hypertension    Hyperthyroidism 2007   Graves Disease   OSA on CPAP    doesn't use CPAP anymore and doesn't have it . 08/28/2022   PVD (peripheral vascular disease)  (HCC)    see Dr. Ashley Royalty   Shortness of breath    due to edema and weight   Sleep apnea    uses a CPAP   Wears dentures    upper 08/28/2022   Wears partial dentures    lower . 08/28/2022     Objective:   BP 134/79   Pulse 82   Temp 97.9 F (36.6 C)   Ht 5' 4.5" (1.638 m)   Wt (!) 311 lb (141.1 kg)   SpO2 98%   BMI 52.56 kg/m  She was weighed on the bioimpedance scale: Body mass index is 52.56 kg/m.  Peak Weight:350 , Body Fat%:60.0, Visceral Fat Rating:25, Weight trend over the last 12 months: Unchanged  General:  Alert, oriented and cooperative. Patient is in no acute distress.  Respiratory: Normal respiratory effort, no problems with respiration noted   Gait: able to ambulate independently  Mental Status: Normal mood and affect. Normal behavior. Normal judgment and thought content.   DIAGNOSTIC DATA REVIEWED:  BMET    Component Value Date/Time   NA 137 04/04/2023 1623   K 4.3 04/04/2023 1623   CL 102 04/04/2023 1623   CO2 28 04/04/2023 1623   GLUCOSE 87 04/04/2023 1623   BUN 13 04/04/2023 1623   CREATININE 1.02 04/04/2023 1623   CALCIUM 10.3 04/04/2023 1623   CALCIUM 10.2 04/04/2023 1623   GFRNONAA >60 08/04/2021 1948   GFRAA >60 03/21/2016 1030   Lab Results  Component Value Date   HGBA1C 6.4 02/26/2023   HGBA1C 6.4 05/25/2021  No results found for: "INSULIN" CBC    Component Value Date/Time   WBC 5.1 10/25/2022 0854   RBC 4.63 10/25/2022 0854   HGB 12.7 10/25/2022 0854   HCT 39.8 10/25/2022 0854   PLT 280.0 10/25/2022 0854   MCV 86.0 10/25/2022 0854   MCH 27.7 08/31/2022 0825   MCHC 32.0 10/25/2022 0854   RDW 14.1 10/25/2022 0854   Iron/TIBC/Ferritin/ %Sat    Component Value Date/Time   IRON 71 10/25/2022 0854   FERRITIN 123.4 10/25/2022 0854   Lipid Panel     Component Value Date/Time   CHOL 217 (H) 04/04/2023 1623   TRIG 122.0 04/04/2023 1623   HDL 64.40 04/04/2023 1623   CHOLHDL 3 04/04/2023 1623   VLDL 24.4 04/04/2023 1623    LDLCALC 128 (H) 04/04/2023 1623   Hepatic Function Panel     Component Value Date/Time   PROT 7.6 04/04/2023 1623   ALBUMIN 4.0 04/04/2023 1623   AST 18 04/04/2023 1623   ALT 17 04/04/2023 1623   ALKPHOS 88 04/04/2023 1623   BILITOT 0.3 04/04/2023 1623      Component Value Date/Time   TSH 1.28 10/25/2022 0854     Assessment and Plan:   Primary hypertension  Class 3 severe obesity due to excess calories with serious comorbidity and body mass index (BMI) of 50.0 to 59.9 in adult (HCC)  OSA (obstructive sleep apnea)  Hyperlipidemia, unspecified hyperlipidemia type        Obesity Treatment / Action Plan:  Patient will work on garnering support from family and friends to begin weight loss journey. Will work on eliminating or reducing the presence of highly palatable, calorie dense foods in the home. Will complete provided nutritional and psychosocial assessment questionnaire before the next appointment. Will be scheduled for indirect calorimetry to determine resting energy expenditure in a fasting state.  This will allow [place] to create a reduced calorie, high-protein meal plan to promote loss of fat mass while preserving muscle mass. Will think about ideas on how to incorporate physical activity into their daily routine. Counseled on the health benefits of losing 5%-15% of total body weight. Was counseled on nutritional approaches to weight loss and benefits of reducing processed foods and consuming plant-based foods and high quality protein as part of nutritional weight management. Was counseled on pharmacotherapy and role as an adjunct in weight management.   Obesity Education Performed Today:  She was weighed on the bioimpedance scale and results were discussed and documented in the synopsis.  We discussed obesity as a disease and the importance of a more detailed evaluation of all the factors contributing to the disease.  We discussed the importance of long term  lifestyle changes which include nutrition, exercise and behavioral modifications as well as the importance of customizing this to her specific health and social needs.  We discussed the benefits of reaching a healthier weight to alleviate the symptoms of existing conditions and reduce the risks of the biomechanical, metabolic and psychological effects of obesity.  Enas Felix Pacini appears to be in the action stage of change and states they are ready to start intensive lifestyle modifications and behavioral modifications.  24 minutes was spent today on this visit including the above counseling, pre-visit chart review, and post-visit documentation.  Reviewed by clinician on day of visit: allergies, medications, problem list, medical history, surgical history, family history, social history, and previous encounter notes pertinent to obesity diagnosis.    Seymour Bars, D.O. DABFM, DABOM [facility] [address] [place], [place] [address] [phone]

## 2023-04-15 ENCOUNTER — Ambulatory Visit: Payer: Medicare Other | Admitting: Dermatology

## 2023-04-16 LAB — HM MAMMOGRAPHY

## 2023-04-17 ENCOUNTER — Encounter: Payer: Self-pay | Admitting: Nurse Practitioner

## 2023-04-25 ENCOUNTER — Encounter: Payer: Self-pay | Admitting: Family Medicine

## 2023-04-25 ENCOUNTER — Ambulatory Visit (INDEPENDENT_AMBULATORY_CARE_PROVIDER_SITE_OTHER): Admitting: Family Medicine

## 2023-04-25 VITALS — BP 144/81 | HR 58 | Temp 98.0°F | Ht 64.5 in | Wt 310.0 lb

## 2023-04-25 DIAGNOSIS — E88819 Insulin resistance, unspecified: Secondary | ICD-10-CM

## 2023-04-25 DIAGNOSIS — Z1331 Encounter for screening for depression: Secondary | ICD-10-CM

## 2023-04-25 DIAGNOSIS — G4733 Obstructive sleep apnea (adult) (pediatric): Secondary | ICD-10-CM

## 2023-04-25 DIAGNOSIS — E66813 Obesity, class 3: Secondary | ICD-10-CM

## 2023-04-25 DIAGNOSIS — I10 Essential (primary) hypertension: Secondary | ICD-10-CM | POA: Diagnosis not present

## 2023-04-25 DIAGNOSIS — R5383 Other fatigue: Secondary | ICD-10-CM | POA: Diagnosis not present

## 2023-04-25 DIAGNOSIS — Z6841 Body Mass Index (BMI) 40.0 and over, adult: Secondary | ICD-10-CM

## 2023-04-25 DIAGNOSIS — E785 Hyperlipidemia, unspecified: Secondary | ICD-10-CM

## 2023-04-25 DIAGNOSIS — R0602 Shortness of breath: Secondary | ICD-10-CM

## 2023-04-25 DIAGNOSIS — E119 Type 2 diabetes mellitus without complications: Secondary | ICD-10-CM

## 2023-04-25 DIAGNOSIS — E669 Obesity, unspecified: Secondary | ICD-10-CM

## 2023-04-25 NOTE — Progress Notes (Signed)
 At a Glance:  Vitals Temp: 98 F (36.7 C) BP: (!) 144/81 Pulse Rate: (!) 58 SpO2: 97 %   Anthropometric Measurements Height: 5' 4.5" (1.638 m) Weight: (!) 310 lb (140.6 kg) BMI (Calculated): 52.41 Starting Weight: 310lb Peak Weight: 350lb   Body Composition  Body Fat %: 59.7 % Fat Mass (lbs): 185 lbs Muscle Mass (lbs): 118 lbs Visceral Fat Rating : 25   Other Clinical Data RMR: 1397 Fasting: Yes Labs: Yes Today's Visit #: 1 Starting Date: 04/25/23    EKG: Normal sinus rhythm, rate 58.  Indirect Calorimeter completed today shows a VO2 of 202 and a REE of 1397.  Her calculated basal metabolic rate is 9147 thus her basal metabolic rate is worse than expected.  Chief Complaint:  Obesity   Subjective:  Kaylee Carpenter (MR# [id number]) is a 68 y.o. female who presents for evaluation and treatment of obesity and related comorbidities.   Kaylee Carpenter is currently in the action stage of change and ready to dedicate time achieving and maintaining a healthier weight. Kaylee Carpenter is interested in becoming our patient and working on intensive lifestyle modifications including (but not limited to) diet and exercise for weight loss.  Kaylee Carpenter has been struggling with her weight. She has been unsuccessful in either losing weight, maintaining weight loss, or reaching her healthy weight goal.  She is working as a Tourist information centre manager, a sedentary job.  She denies high stress levels.  She is living with her 67 year old grandson who will be moving out soon.  She has 2 grown daughters that live locally.  She has a boyfriend.  She would like to lose weight in order to have right knee replacement surgery in the near future.  Exercise has been limited due to right knee pain.  She has a positive family history for obesity and type 2 diabetes.  She does eat out frequently and skips breakfast and lunch.  She has been snacking on crackers at work during the day.  Kaylee Carpenter's habits were reviewed today and  are as follows: she struggles with family and or coworkers weight loss sabotage, her desired weight loss is 70 lb, she has been heavy most of her life, she snacks frequently in the evenings, she skips meals frequently, she is frequently drinking liquids with calories, she frequently makes poor food choices, and she struggles with emotional eating.   Other Fatigue Eliot admits to daytime somnolence and admits to waking up still tired. Patient has a history of symptoms of daytime fatigue. Kaylee Carpenter generally gets 6 or 7 hours of sleep per night, and states that she has nightime awakenings. Snoring is present. Apneic episodes are present. Epworth Sleepiness Score is 9.   Diagnosed with OSA, last seen by Dr Vickey Huger 2018.  Stopped wearing CPAP 4 years ago (no reason)  Shortness of Breath Kaylee Carpenter notes increasing shortness of breath with exercising and seems to be worsening over time with weight gain. She notes getting out of breath sooner with activity than she used to. This has gotten worse recently. Kaylee Carpenter denies shortness of breath at rest or orthopnea.   Depression Screen Kaylee Carpenter's Food and Mood (modified PHQ-9) score was 7.     04/04/2023    3:49 PM  Depression screen PHQ 2/9  Decreased Interest 0  Down, Depressed, Hopeless 0  PHQ - 2 Score 0     Assessment and Plan:   Other Fatigue Kaylee Carpenter does feel that her weight is causing her energy to be lower than it should be. Fatigue  may be related to obesity, depression or many other causes. Labs will be ordered, and in the meanwhile, Avarose will focus on self care including making healthy food choices, increasing physical activity and focusing on stress reduction.  Shortness of Breath Kaylee Carpenter does feel that she gets out of breath more easily that she used to when she exercises. Kaylee Carpenter's shortness of breath appears to be obesity related and exercise induced. She has agreed to work on weight loss and gradually increase exercise to  treat her exercise induced shortness of breath. Will continue to monitor closely.  Kaylee Carpenter had a positive depression screening. Depression is commonly associated with obesity and often results in emotional eating behaviors. We will monitor this closely and work on CBT to help improve the non-hunger eating patterns. Referral to Psychology may be required if no improvement is seen as she continues in our clinic.    Problem List Items Addressed This Visit     OSA (obstructive sleep apnea) She is no longer using CPAP.  She has been sleeping in an upright position with an adjustable bed.  She does still snore and has daytime somnolence.  We discussed how untreated sleep apnea may be contributing to elevated blood pressure, weight gain and low metabolic rate.  Will recommend following up with sleep medicine for further evaluation and treatment.  Begin active plan for weight reduction.   Obesity   Hyperlipidemia Lab Results  Component Value Date   CHOL 217 (H) 04/04/2023   HDL 64.40 04/04/2023   LDLCALC 128 (H) 04/04/2023   TRIG 122.0 04/04/2023   CHOLHDL 3 04/04/2023   The 10-year ASCVD risk score (Arnett DK, et al., 2019) is: 32.9%   Values used to calculate the score:     Age: 32 years     Sex: Female     Is Non-Hispanic African American: Yes     Diabetic: Yes     Tobacco smoker: No     Systolic Blood Pressure: 144 mmHg     Is BP treated: Yes     HDL Cholesterol: 64.4 mg/dL     Total Cholesterol: 217 mg/dL 32-GMWN ASCVD risk score high. She was recently stopped on atorvastatin due to myalgias and started on omega-3 fish oil supplement by her PCP.  Look for lipid improvements with dietary change and weight loss.  She will likely need additional cholesterol treatment.    Hypertension Blood pressure is slightly elevated today.  She is taking carvedilol 6.25 mg twice daily and is spironolactone 25 mg once daily by her PCP.  She denies headache or chest pain.  Look for blood pressure  improvements with weight reduction.  Limit intake of sodium.    Fatigue   Relevant Orders   CBC   Vitamin B12   Folate   TSH Rfx on Abnormal to Free T4   Diabetes mellitus (HCC) Lab Results  Component Value Date   HGBA1C 6.4 02/26/2023  She has had 1 A1c over 6.5 consistent with type 2 diabetes.  She is currently diet controlled.  She has never used metformin.  Her diet is currently high in added sugar and refined carbohydrates with little physical activity.  Begin prescribed dietary plan.  Begin active plan for weight reduction.  Consider use of GLP-1 receptor agonist.     Other Visit Diagnoses       SOBOE (shortness of breath on exertion)    -  Primary     Insulin resistance       Relevant Orders  Insulin, random       Miata is currently in the action stage of change and her goal is to continue with weight loss efforts. I recommend Audree begin the structured treatment plan as follows:  She has agreed to Category 2 Plan  Exercise goals: Older adults should follow the adult guidelines. When older adults cannot meet the adult guidelines, they should be as physically active as their abilities and conditions will allow.  Behavioral modification strategies:increasing lean protein intake, increase H2O intake, decrease liquid calories, increase high fiber foods, decreasing eating out, no skipping meals, meal planning and cooking strategies, keeping healthy foods in the home, better snacking choices, planning for success, and decrease junk food   She was informed of the importance of frequent follow-up visits to maximize her success with intensive lifestyle modifications for her multiple health conditions. She was informed we would discuss her lab results at her next visit unless there is a critical issue that needs to be addressed sooner. Tyanna agreed to keep her next visit at the agreed upon time to discuss these results.  Objective:  General: Cooperative, alert, well  developed, in no acute distress. HEENT: Conjunctivae and lids unremarkable. Cardiovascular: Regular rhythm.  Lungs: Normal work of breathing. Neurologic: No focal deficits.   Lab Results  Component Value Date   CREATININE 1.02 04/04/2023   BUN 13 04/04/2023   NA 137 04/04/2023   K 4.3 04/04/2023   CL 102 04/04/2023   CO2 28 04/04/2023   Lab Results  Component Value Date   ALT 17 04/04/2023   AST 18 04/04/2023   ALKPHOS 88 04/04/2023   BILITOT 0.3 04/04/2023   Lab Results  Component Value Date   HGBA1C 6.4 02/26/2023   HGBA1C 6.6 (H) 10/25/2022   HGBA1C 6.2 07/16/2022   HGBA1C 6.4 05/25/2021   No results found for: "INSULIN" Lab Results  Component Value Date   TSH 1.28 10/25/2022   Lab Results  Component Value Date   CHOL 217 (H) 04/04/2023   HDL 64.40 04/04/2023   LDLCALC 128 (H) 04/04/2023   TRIG 122.0 04/04/2023   CHOLHDL 3 04/04/2023   Lab Results  Component Value Date   WBC 5.1 10/25/2022   HGB 12.7 10/25/2022   HCT 39.8 10/25/2022   MCV 86.0 10/25/2022   PLT 280.0 10/25/2022   Lab Results  Component Value Date   IRON 71 10/25/2022   FERRITIN 123.4 10/25/2022    Attestation Statements:  Reviewed by clinician on day of visit: allergies, medications, problem list, medical history, surgical history, family history, social history, and previous encounter notes.  Time spent on visit including pre-visit chart review and post-visit charting and care was 44 minutes.   Glennis Brink, DO

## 2023-04-26 LAB — CBC
Hematocrit: 40.5 % (ref 34.0–46.6)
Hemoglobin: 13.3 g/dL (ref 11.1–15.9)
MCH: 27.8 pg (ref 26.6–33.0)
MCHC: 32.8 g/dL (ref 31.5–35.7)
MCV: 85 fL (ref 79–97)
Platelets: 321 10*3/uL (ref 150–450)
RBC: 4.79 x10E6/uL (ref 3.77–5.28)
RDW: 12.5 % (ref 11.7–15.4)
WBC: 5.9 10*3/uL (ref 3.4–10.8)

## 2023-04-26 LAB — TSH RFX ON ABNORMAL TO FREE T4: TSH: 1.55 u[IU]/mL (ref 0.450–4.500)

## 2023-04-26 LAB — INSULIN, RANDOM: INSULIN: 13.4 u[IU]/mL (ref 2.6–24.9)

## 2023-04-26 LAB — VITAMIN B12: Vitamin B-12: 425 pg/mL (ref 232–1245)

## 2023-04-26 LAB — FOLATE: Folate: 6.4 ng/mL (ref >3.0)

## 2023-05-14 ENCOUNTER — Ambulatory Visit (INDEPENDENT_AMBULATORY_CARE_PROVIDER_SITE_OTHER): Admitting: Family Medicine

## 2023-05-14 ENCOUNTER — Encounter: Payer: Self-pay | Admitting: Family Medicine

## 2023-05-14 VITALS — BP 128/78 | HR 57 | Temp 98.0°F | Ht 64.5 in | Wt 306.0 lb

## 2023-05-14 DIAGNOSIS — G4733 Obstructive sleep apnea (adult) (pediatric): Secondary | ICD-10-CM

## 2023-05-14 DIAGNOSIS — E66813 Obesity, class 3: Secondary | ICD-10-CM

## 2023-05-14 DIAGNOSIS — E559 Vitamin D deficiency, unspecified: Secondary | ICD-10-CM | POA: Diagnosis not present

## 2023-05-14 DIAGNOSIS — I10 Essential (primary) hypertension: Secondary | ICD-10-CM

## 2023-05-14 DIAGNOSIS — E119 Type 2 diabetes mellitus without complications: Secondary | ICD-10-CM

## 2023-05-14 DIAGNOSIS — Z6841 Body Mass Index (BMI) 40.0 and over, adult: Secondary | ICD-10-CM

## 2023-05-14 NOTE — Patient Instructions (Addendum)
 [employer] Carb smart bars  Great job staying off the sugary drinks!  You have the option to have a toasted [place] Muffin or a low carb wrap with lunch or one of the other options given Remember your fresh fruit serving with lunch  Walking as tolerated Aim for 15 min of chair exercises daily

## 2023-05-14 NOTE — Progress Notes (Signed)
 Office: [phone]  /  Fax: [phone]  WEIGHT SUMMARY AND BIOMETRICS  Starting Date: 04/25/23  Starting Weight: 310lb   Weight Lost Since Last Visit: 4lb   Vitals Temp: 98 F (36.7 C) BP: 128/78 Pulse Rate: (!) 57 SpO2: 97 %   Body Composition  Body Fat %: 58 % Fat Mass (lbs): 177.8 lbs Muscle Mass (lbs): 122.4 lbs Visceral Fat Rating : 24    HPI  Chief Complaint: OBESITY  Kaylee Carpenter is here to discuss her progress with her obesity treatment plan. She is on the the Category 2 Plan and states she is following her eating plan approximately 100 % of the time. She states she is exercising 0 minutes 0 times per week.  Interval History:  Since last office visit she is down 4 lb She has gained muscle mass and loss body fat since last visit She is doing well on her meal plan with adequate fullness She cut out sweet tea and regular soda She cut out fried foods She feels adequately full at the end of breakfast She didn't like the bread for lunch and hasn't been getting in her fruit at lunch She is doing well with dinner on plan She saves her snack calories for night Right knee pain does limit exercise  Pharmacotherapy: None  PHYSICAL EXAM:  Blood pressure 128/78, pulse (!) 57, temperature 98 F (36.7 C), height 5' 4.5" (1.638 m), weight (!) 306 lb (138.8 kg), SpO2 97%. Body mass index is 51.71 kg/m.  General: She is overweight, cooperative, alert, well developed, and in no acute distress. PSYCH: Has normal mood, affect and thought process.   Lungs: Normal breathing effort, no conversational dyspnea.   ASSESSMENT AND PLAN  TREATMENT PLAN FOR OBESITY:  Recommended Dietary Goals  Kaylee Carpenter is currently in the action stage of change. As such, her goal is to continue weight management plan. She has agreed to the Category 2 Plan.  Behavioral Intervention  We discussed the following Behavioral Modification Strategies today: increasing lean protein intake to  established goals, increasing fiber rich foods, avoiding skipping meals, increasing water intake , work on meal planning and preparation, keeping healthy foods at home, identifying sources and decreasing liquid calories, work on managing stress, creating time for self-care and relaxation, avoiding temptations and identifying enticing environmental cues, and continue to work on maintaining a reduced calorie state, getting the recommended amount of protein, incorporating whole foods, making healthy choices, staying well hydrated and practicing mindfulness when eating..  Additional resources provided today: NA  Recommended Physical Activity Goals  Kaylee Carpenter has been advised to work up to 150 minutes of moderate intensity aerobic activity a week and strengthening exercises 2-3 times per week for cardiovascular health, weight loss maintenance and preservation of muscle mass.   She has agreed to Start aerobic activity with a goal of 150 minutes a week at moderate intensity.   Pharmacotherapy changes for the treatment of obesity: None  ASSOCIATED CONDITIONS ADDRESSED TODAY  OSA (obstructive sleep apnea) She has a history of OSA not on CPAP.  She was last seen by Dr. Albertina Hugger.  She does complain of daytime somnolence and poor sleep at night.  We discussed the importance of treating sleep apnea and weight management. Referral made to Dr. Albertina Hugger for follow-up -     Ambulatory referral to Neurology  Class 3 severe obesity due to excess calories with serious comorbidity and body mass index (BMI) of 50.0 to 59.9 in adult [facility])  Type 2 diabetes mellitus without complication,  without long-term current use of insulin  (HCC) Max A1c was 6.5.  She currently has diet-controlled type 2 diabetes.  She is currently not on metformin.  She has done a great job of cutting out sugar sweetened beverages and abiding by her prescribed meal plan.  She has started her active plan for weight reduction and has room for  improvement with regular exercise.  We discussed the option of adding in Ozempic 0.25 mg once weekly injection for type 2 diabetes.  She would like to wait at her upcoming visit with her PCP.  We discussed mechanism of action and potential adverse side effects.  Primary hypertension Blood pressure is under good control on carvedilol  6.25 mg twice daily, spironolactone  25 mg once daily.  Continue current medications for hypertension.  Look for improvements with weight reduction.  Avoid use of stimulant type antiobesity medications.  Vitamin D  deficiency Last vitamin D  Lab Results  Component Value Date   VD25OH 18.21 (L) 04/04/2023  Her PCP has started on vitamin D  50,000 IU once weekly.  She will be due for repeat lab in the next 2 to 3 months.  Her energy level is starting to improve.      She was informed of the importance of frequent follow up visits to maximize her success with intensive lifestyle modifications for her multiple health conditions.   ATTESTASTION STATEMENTS:  Reviewed by clinician on day of visit: allergies, medications, problem list, medical history, surgical history, family history, social history, and previous encounter notes pertinent to obesity diagnosis.   I have personally spent 33 minutes total time today in preparation, patient care, nutritional counseling and education,  and documentation for this visit, including the following: review of most recent clinical lab tests,  reviewing medical assistant documentation, review and interpretation of bioimpedence results.     [name], D.O. DABFM, DABOM [facility] [address] [place], [place] [address] [phone]

## 2023-06-02 ENCOUNTER — Other Ambulatory Visit: Payer: Self-pay | Admitting: Nurse Practitioner

## 2023-06-02 ENCOUNTER — Other Ambulatory Visit: Payer: Self-pay | Admitting: Internal Medicine

## 2023-06-02 DIAGNOSIS — I10 Essential (primary) hypertension: Secondary | ICD-10-CM

## 2023-06-06 ENCOUNTER — Telehealth: Payer: Self-pay | Admitting: Nurse Practitioner

## 2023-06-06 ENCOUNTER — Ambulatory Visit (INDEPENDENT_AMBULATORY_CARE_PROVIDER_SITE_OTHER): Admitting: Nurse Practitioner

## 2023-06-06 VITALS — BP 136/76 | HR 60 | Temp 98.0°F | Ht 64.5 in | Wt 308.2 lb

## 2023-06-06 DIAGNOSIS — E559 Vitamin D deficiency, unspecified: Secondary | ICD-10-CM

## 2023-06-06 DIAGNOSIS — J309 Allergic rhinitis, unspecified: Secondary | ICD-10-CM | POA: Diagnosis not present

## 2023-06-06 MED ORDER — CETIRIZINE HCL 10 MG PO TABS: 10.0000 mg | ORAL_TABLET | Freq: Every day | ORAL | 3 refills | Status: AC

## 2023-06-06 NOTE — Telephone Encounter (Signed)
 Please start prior auth for [facility] for weight loss in this patient. Then let me know what insurance says as I will not send in prescription until I have heard back from insurance

## 2023-06-06 NOTE — Assessment & Plan Note (Signed)
 Chronic, asymptomatic Lab close at time of appointment.  Patient return to clinic in 1 to 2 weeks to have lab draw, further recommendations may be made based upon these results.

## 2023-06-06 NOTE — Assessment & Plan Note (Signed)
 Chronic Labs close at time of appointment Patient will return to clinic in 1 to 2 weeks for lab draw.  Further recommendations may be made based upon the results.

## 2023-06-06 NOTE — Assessment & Plan Note (Signed)
 Chronic, stable Cetirizine 10 mg a mouth daily sent to patient's pharmacy

## 2023-06-06 NOTE — Progress Notes (Signed)
 Established Patient Office Visit  Subjective   Patient ID: Kaylee Carpenter, female    DOB: 1955/05/11  Age: 68 y.o. MRN: [id number]  Chief Complaint  Patient presents with   Obesity    Obesity/osteoarthritis: Patient has made significant changes to lifestyle specifically counting calories and doing chair exercises.  Over the last 2 months weight on our scale was 314 pounds, today is 308 pounds.  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 Hypercalcemia/vitamin D  deficiency: Incidental finding.  Is taking vitamin D  3 10,000 IUs by mouth daily.  Needs repeat vitamin D , PTH, and calcium  levels. Allergic rhinitis: Well-controlled with daily cetirizine, patient requesting refill.    ROS: see HPI    Objective:     BP 136/76   Pulse 60   Temp 98 F (36.7 C) (Temporal)   Ht 5' 4.5" (1.638 m)   Wt (!) 308 lb 4 oz (139.8 kg)   SpO2 95%   BMI 52.09 kg/m  BP Readings from Last 3 Encounters:  06/06/23 136/76  05/14/23 128/78  04/25/23 (!) 144/81   Wt Readings from Last 3 Encounters:  06/06/23 (!) 308 lb 4 oz (139.8 kg)  05/14/23 (!) 306 lb (138.8 kg)  04/25/23 (!) 310 lb (140.6 kg)      Physical Exam Vitals reviewed.  Constitutional:      General: She is not in acute distress.    Appearance: Normal appearance.  HENT:     Head: Normocephalic and atraumatic.  Neck:     Vascular: No carotid bruit.  Cardiovascular:     Rate and Rhythm: Normal rate and regular rhythm.     Pulses: Normal pulses.     Heart sounds: Normal heart sounds.  Pulmonary:     Effort: Pulmonary effort is normal.     Breath sounds: Normal breath sounds.  Skin:    General: Skin is warm and dry.  Neurological:     General: No focal deficit present.     Mental Status: She is alert and oriented to person, place, and time.  Psychiatric:        Mood and Affect: Mood normal.        Behavior: Behavior normal.         Judgment: Judgment normal.      No results found for any visits on 06/06/23.    The 10-year ASCVD risk score (Arnett DK, et al., 2019) is: 29.9%    Assessment & Plan:   Problem List Items Addressed This Visit       Respiratory   Allergic rhinitis   Chronic, stable Cetirizine 10 mg a mouth daily sent to patient's pharmacy      Relevant Medications   cetirizine (ZYRTEC) 10 MG tablet     Other   Vitamin D  deficiency - Primary   Chronic Labs close at time of appointment Patient will return to clinic in 1 to 2 weeks for lab draw.  Further recommendations may be made based upon the results.      Relevant Orders   VITAMIN D  25 Hydroxy (Vit-D Deficiency, Fractures)   Basic metabolic panel with GFR   PTH, Intact and Calcium    Hypercalcemia   Chronic, asymptomatic Lab close at time of appointment.  Patient return to clinic in 1 to 2 weeks to have lab draw, further recommendations may be made based upon these results.      Relevant Orders  VITAMIN D  25 Hydroxy (Vit-D Deficiency, Fractures)   Basic metabolic panel with GFR   PTH, Intact and Calcium     Return in about 1 month (around 07/07/2023) for with Britney or Padonda for weight loss (wegovy).    Zorita Hiss, NP

## 2023-06-07 ENCOUNTER — Other Ambulatory Visit (HOSPITAL_COMMUNITY): Payer: Self-pay

## 2023-06-07 ENCOUNTER — Telehealth: Payer: Self-pay

## 2023-06-07 NOTE — Telephone Encounter (Signed)
 Pharmacy Patient Advocate Encounter   Received notification from Pt Calls Messages that prior authorization for Wegovy 0.25mg /0.49ml is required/requested.   Insurance verification completed.   The patient is insured through [facility] .   Per test claim: PA required; PA submitted to above mentioned insurance via CoverMyMeds Key/confirmation #/EOC [facility] Status is pending

## 2023-06-10 ENCOUNTER — Encounter: Payer: Self-pay | Admitting: Family Medicine

## 2023-06-10 ENCOUNTER — Ambulatory Visit (INDEPENDENT_AMBULATORY_CARE_PROVIDER_SITE_OTHER): Admitting: Family Medicine

## 2023-06-10 VITALS — BP 146/84 | HR 63 | Temp 98.0°F | Ht 64.5 in | Wt 304.0 lb

## 2023-06-10 DIAGNOSIS — E119 Type 2 diabetes mellitus without complications: Secondary | ICD-10-CM

## 2023-06-10 DIAGNOSIS — Z6841 Body Mass Index (BMI) 40.0 and over, adult: Secondary | ICD-10-CM

## 2023-06-10 DIAGNOSIS — I10 Essential (primary) hypertension: Secondary | ICD-10-CM | POA: Diagnosis not present

## 2023-06-10 DIAGNOSIS — G4733 Obstructive sleep apnea (adult) (pediatric): Secondary | ICD-10-CM

## 2023-06-10 DIAGNOSIS — E66813 Obesity, class 3: Secondary | ICD-10-CM

## 2023-06-10 DIAGNOSIS — E559 Vitamin D deficiency, unspecified: Secondary | ICD-10-CM | POA: Diagnosis not present

## 2023-06-10 NOTE — Progress Notes (Signed)
 Office: [phone]  /  Fax: [phone]  WEIGHT SUMMARY AND BIOMETRICS  Starting Date: 04/25/23  Starting Weight: 310lb   Weight Lost Since Last Visit: 2lb   Vitals Temp: 98 F (36.7 C) BP: (!) 146/84 Pulse Rate: 63 SpO2: 98 %   Body Composition  Body Fat %: 48.7 % Fat Mass (lbs): 148.2 lbs Muscle Mass (lbs): 148.4 lbs Total Body Water (lbs): 115 lbs Visceral Fat Rating : 20     HPI  Chief Complaint: OBESITY  Kaylee Carpenter is here to discuss her progress with her obesity treatment plan. She is on the the Category 2 Plan and states she is following her eating plan approximately 80 % of the time. She states she is doing chair exercise for 15-20 minutes 2 times per week.   Interval History:  Since last office visit she is down 2 lb This gives her a net weight loss of 6 pounds in the past 1 month of medically supervised weight management She has done a better job of reading labels for sugar when grocery shopping She feels like she may be eating too many grapes She is trying to eat on plan for breakfast and dinner on plan (even when eating out) She has been cooking at home more She is ordering more salads out Her PCP is working on getting her Kaylee Carpenter She has a good support system at home Her exercise has been limited due to right knee DJD pain, actively working on preop BMI reduction  Pharmacotherapy: None  PHYSICAL EXAM:  Blood pressure (!) 146/84, pulse 63, temperature 98 F (36.7 C), height 5' 4.5" (1.638 m), weight (!) 304 lb (137.9 kg), SpO2 98%. Body mass index is 51.38 kg/m.  General: She is overweight, cooperative, alert, well developed, and in no acute distress. PSYCH: Has normal mood, affect and thought process.   Lungs: Normal breathing effort, no conversational dyspnea.   ASSESSMENT AND PLAN  TREATMENT PLAN FOR OBESITY:  Recommended Dietary Goals  Kaylee Carpenter is currently in the action stage of change. As such, her goal is to continue weight  management plan. She has agreed to the Category 2 Plan. Reviewed lunch alternative on after visit summary  Behavioral Intervention  We discussed the following Behavioral Modification Strategies today: increasing lean protein intake to established goals, increasing fiber rich foods, increasing water intake , work on meal planning and preparation, keeping healthy foods at home, identifying sources and decreasing liquid calories, work on managing stress, creating time for self-care and relaxation, avoiding temptations and identifying enticing environmental cues, continue to work on implementation of reduced calorie nutritional plan, continue to practice mindfulness when eating, planning for success, and continue to work on maintaining a reduced calorie state, getting the recommended amount of protein, incorporating whole foods, making healthy choices, staying well hydrated and practicing mindfulness when eating..  Additional resources provided today: NA  Recommended Physical Activity Goals  Kaylee Carpenter has been advised to work up to 150 minutes of moderate intensity aerobic activity a week and strengthening exercises 2-3 times per week for cardiovascular health, weight loss maintenance and preservation of muscle mass.   She has agreed to Increase the intensity, frequency or duration of aerobic exercises   Recommend 5-minute walks 3 times a day  Pharmacotherapy changes for the treatment of obesity: None  ASSOCIATED CONDITIONS ADDRESSED TODAY  Type 2 diabetes mellitus without complication, without long-term current use of insulin  (HCC) A1c peak at 6.6 in the past year.  She is currently not on any diabetes medication.  She has been seen and evaluated recently by her PCP.  She is actively working on dietary change and weight loss but her exercise has been limited due to knee pain.  Continue current lifestyle changes.  Consider referral to registered dietitian for additional assistance.  Recommend use  of Ozempic but her PCP has written her for Avala.  Will change over to Ozempic if Kaylee Carpenter is not covered by insurance.  Class 3 severe obesity due to excess calories with body mass index (BMI) of 50.0 to 59.9 in adult  OSA (obstructive sleep apnea) She is awaiting a visit with Kaylee Carpenter, referral has been placed that she has a history of OSA not on CPAP. Continue active plan for weight reduction and work on sleep quality  Primary hypertension Blood pressure remains elevated.  She is currently on carvedilol  6.25 mg twice daily, spironolactone  25 mg daily.  With a GFR of 56, she may be a better candidate for use of an ACE inhibitor or ARB. Continue active plan for weight reduction and avoid high sodium foods  Vitamin D  deficiency Last vitamin D  Lab Results  Component Value Date   VD25OH 18.21 (L) 04/04/2023  She is currently on vitamin D  50,000 IU prescribed by her PCP on 04/05/2023 for vitamin D  deficiency.  Energy level is starting to improve.  Continue vitamin D  50,000 IU once weekly.  Her level will be due in the next 1 to 2 months.      She was informed of the importance of frequent follow up visits to maximize her success with intensive lifestyle modifications for her multiple health conditions.   ATTESTASTION STATEMENTS:  Reviewed by clinician on day of visit: allergies, medications, problem list, medical history, surgical history, family history, social history, and previous encounter notes pertinent to obesity diagnosis.   I have personally spent 30 minutes total time today in preparation, patient care, nutritional counseling and education,  and documentation for this visit, including the following: review of most recent clinical lab tests, prescribing medications/ refilling medications, reviewing medical assistant documentation, review and interpretation of bioimpedence results.     Kaylee Carpenter, D.O. DABFM, DABOM [facility] [address] [place], [place] [address] [phone]

## 2023-06-10 NOTE — Patient Instructions (Addendum)
 Lunch option: OIKOS PRO -any flavor  + Purely Elizabeth granola OR Catalina Crunch OR Magic Spoon cereal -  for crunch Add fresh berries or fresh peach  No sugar added cool whip  (( No grapes, pineapples, mangos or bananas ))  Aim for 5 min of walking 3 x a day (( no stairs )) Check out chair exercises on YouTube

## 2023-06-11 ENCOUNTER — Telehealth: Payer: Self-pay | Admitting: Nurse Practitioner

## 2023-06-11 NOTE — Telephone Encounter (Signed)
.  Pharmacy Patient Advocate Encounter  Received notification from [employer] that Prior Authorization for Wegovy 0.25mg /0.3ml has been DENIED.  See denial reason below. No denial letter attached in CMM. Will attach denial letter to Media tab once received.   PA #/Case ID/Reference #: [id number]

## 2023-06-11 NOTE — Telephone Encounter (Signed)
 Please call patient and cancel her appointment in June with Hershel Los for weight loss. Patient is now seeing Dr. Ambrosio Junker for chronic management of obesity so she does not need to see us  as well. Please schedule her a follow-up with me sometime in September or October instead of other chronic medical management.

## 2023-06-12 NOTE — Telephone Encounter (Signed)
Made pt aware of denial

## 2023-06-12 NOTE — Telephone Encounter (Signed)
 Appointment is canceled with reason why.

## 2023-07-08 ENCOUNTER — Ambulatory Visit: Admitting: Family Medicine

## 2023-07-09 ENCOUNTER — Encounter: Payer: Self-pay | Admitting: Family Medicine

## 2023-07-09 ENCOUNTER — Ambulatory Visit (INDEPENDENT_AMBULATORY_CARE_PROVIDER_SITE_OTHER): Admitting: Family Medicine

## 2023-07-09 VITALS — BP 131/84 | HR 71 | Temp 98.3°F | Ht 64.5 in | Wt 294.0 lb

## 2023-07-09 DIAGNOSIS — M1711 Unilateral primary osteoarthritis, right knee: Secondary | ICD-10-CM

## 2023-07-09 DIAGNOSIS — Z7985 Long-term (current) use of injectable non-insulin antidiabetic drugs: Secondary | ICD-10-CM

## 2023-07-09 DIAGNOSIS — Z6841 Body Mass Index (BMI) 40.0 and over, adult: Secondary | ICD-10-CM

## 2023-07-09 DIAGNOSIS — E66813 Obesity, class 3: Secondary | ICD-10-CM

## 2023-07-09 DIAGNOSIS — E119 Type 2 diabetes mellitus without complications: Secondary | ICD-10-CM

## 2023-07-09 DIAGNOSIS — G4733 Obstructive sleep apnea (adult) (pediatric): Secondary | ICD-10-CM | POA: Diagnosis not present

## 2023-07-09 MED ORDER — SEMAGLUTIDE(0.25 OR 0.5MG/DOS) 2 MG/3ML ~~LOC~~ SOPN: 0.2500 mg | PEN_INJECTOR | SUBCUTANEOUS | 0 refills | Status: DC

## 2023-07-09 NOTE — Progress Notes (Signed)
 Office: [phone]  /  Fax: [phone]  WEIGHT SUMMARY AND BIOMETRICS  Starting Date: 04/25/23  Starting Weight: 310lb   Weight Lost Since Last Visit: 10lb   Vitals Temp: 98.3 F (36.8 C) BP: 131/84 Pulse Rate: 71 SpO2: 98 %   Body Composition  Body Fat %: 47.2 % Fat Mass (lbs): 138.8 lbs Muscle Mass (lbs): 147.4 lbs Total Body Water (lbs): 104.8 lbs Visceral Fat Rating : 19   HPI  Chief Complaint: OBESITY  Kaylee Carpenter is here to discuss her progress with her obesity treatment plan. She is on the the Category 2 Plan and states she is following her eating plan approximately 80 % of the time. She states she is doing chair exercise.   Interval History:  Since last office visit she is down 10 lb This gives her a net weight loss of 16 lb in the past 2 mos That is a 5.1% total body weight loss She is down 1 lb of muscle mass and down 9.4 lb of body fat She did not get approval for Wegovy thru her PCP She has not yet been called for sleep medicine referral She has to limit walking due to R DJD knee pain but is doing some chair exercises She is getting in more fruits and veggies She is working on pre op BMI reduction for R knee arthroplasty She has been more mindful of her food choices, bring a light lunch form Reviewed making better food choices when eating fast food  Pharmacotherapy: None  PHYSICAL EXAM:  Blood pressure 131/84, pulse 71, temperature 98.3 F (36.8 C), height 5' 4.5 (1.638 m), weight 294 lb (133.4 kg), SpO2 98%. Body mass index is 49.69 kg/m.  General: She is overweight, cooperative, alert, well developed, and in no acute distress. PSYCH: Has normal mood, affect and thought process.   Lungs: Normal breathing effort, no conversational dyspnea.   ASSESSMENT AND PLAN  TREATMENT PLAN FOR OBESITY:  Recommended Dietary Goals  Kaylee Carpenter is currently in the action stage of change. As such, her goal is to continue weight management plan. She has  agreed to the Category 2 Plan.  Behavioral Intervention  We discussed the following Behavioral Modification Strategies today: increasing lean protein intake to established goals, increasing fiber rich foods, increasing water intake , work on meal planning and preparation, keeping healthy foods at home, identifying sources and decreasing liquid calories, work on managing stress, creating time for self-care and relaxation, avoiding temptations and identifying enticing environmental cues, planning for success, and continue to work on maintaining a reduced calorie state, getting the recommended amount of protein, incorporating whole foods, making healthy choices, staying well hydrated and practicing mindfulness when eating..  Additional resources provided today: NA  Recommended Physical Activity Goals  Keani has been advised to work up to 150 minutes of moderate intensity aerobic activity a week and strengthening exercises 2-3 times per week for cardiovascular health, weight loss maintenance and preservation of muscle mass.   She has agreed to Think about enjoyable ways to increase daily physical activity and overcoming barriers to exercise and Increase physical activity in their day and reduce sedentary time (increase NEAT).  Pharmacotherapy changes for the treatment of obesity: Begin Ozempic 0.25 mg once weekly injection Patient denies a personal or family history of pancreatitis, medullary thyroid  carcinoma or multiple endocrine neoplasia type II. Recommend reviewing pen training video online. Reviewed [email or web address] for further information including pen training video  ASSOCIATED CONDITIONS ADDRESSED TODAY  Type 2 diabetes mellitus without complication,  without long-term current use of insulin  [facility]) Lab Results  Component Value Date   HGBA1C 6.4 02/26/2023  A1c was 6.6 8 mos ago in the diabetic range She is not on metformin but is actively working on prescribed diet, reducing intake of  sugar Exercise has been limited due to severe right knee DJD pain She is a good candidate for use of Ozempic 0.25 mg once weekly injection.  We have discussed mechanism of action and potential adverse side effects.  -     Semaglutide(0.25 or 0.5MG /DOS); Inject 0.25 mg into the skin once a week. Use once weekly as directed  Dispense: 3 mL; Refill: 0  Primary osteoarthritis of right knee Stable She is using diclofenac  gel as needed Walking has been limited Actively working on BMI reduction in preparation for total knee arthroplasty   Class 3 severe obesity due to excess calories with body mass index (BMI) of 45.0 to 49.9 in adult  OSA (obstructive sleep apnea) Awaiting visit with Dr. Albertina Hugger for evaluation and treatment of OSA, previously diagnosed and not on CPAP.  Does have complaints of fatigue and daytime somnolence.  Will continue to work on weight loss and expedite her visit.     She was informed of the importance of frequent follow up visits to maximize her success with intensive lifestyle modifications for her multiple health conditions.   ATTESTASTION STATEMENTS:  Reviewed by clinician on day of visit: allergies, medications, problem list, medical history, surgical history, family history, social history, and previous encounter notes pertinent to obesity diagnosis.   I have personally spent 30 minutes total time today in preparation, patient care, nutritional counseling and education,  and documentation for this visit, including the following: review of most recent clinical lab tests, prescribing medications/ refilling medications, reviewing medical assistant documentation, review and interpretation of bioimpedence results.     [name], D.O. DABFM, DABOM [facility] [address] [place], [place] [address] [phone]

## 2023-07-10 ENCOUNTER — Telehealth (INDEPENDENT_AMBULATORY_CARE_PROVIDER_SITE_OTHER): Payer: Self-pay | Admitting: *Deleted

## 2023-07-10 NOTE — Telephone Encounter (Signed)
 Medication for (Semaglutide-0.25 or 0.5 mg /dos, 2 mg/3 ml sopn) has been sent to plan for prior approval.Waiting for response.

## 2023-07-18 ENCOUNTER — Telehealth: Payer: Self-pay | Admitting: Nurse Practitioner

## 2023-07-18 NOTE — Telephone Encounter (Signed)
 Copied from CRM [id number]. Topic: Clinical - Medication Refill >> Jul 18, 2023  3:13 PM Mesmerise C wrote: Medication: torsemide  (DEMADEX ) 20 MG tablet  Has the patient contacted their pharmacy? Yes (Agent: If no, request that the patient contact the pharmacy for the refill. If patient does not wish to contact the pharmacy document the reason why and proceed with request.) (Agent: If yes, when and what did the pharmacy advise?) Stated they were working on it This is the patient's preferred pharmacy:  [facility] [phone] - [place], [place] - [address] AT [facility] [address]  [place] [address] Phone: [phone] Fax: [phone]  Is this the correct pharmacy for this prescription? Yes If no, delete pharmacy and type the correct one.   Has the prescription been filled recently? No  Is the patient out of the medication? Yes  Has the patient been seen for an appointment in the last year OR does the patient have an upcoming appointment? Yes  Can we respond through MyChart? Yes  Agent: Please be advised that Rx refills may take up to 3 business days. We ask that you follow-up with your pharmacy.

## 2023-07-18 NOTE — Telephone Encounter (Signed)
 Pt requesting medication not on active list. Please advise.

## 2023-07-18 NOTE — Telephone Encounter (Signed)
 Called pt and left detail message about giving us  a call back in regards medication not part of her list.

## 2023-07-24 ENCOUNTER — Other Ambulatory Visit: Payer: Self-pay | Admitting: Nurse Practitioner

## 2023-07-24 ENCOUNTER — Telehealth: Payer: Self-pay

## 2023-07-24 DIAGNOSIS — I10 Essential (primary) hypertension: Secondary | ICD-10-CM

## 2023-07-24 DIAGNOSIS — I739 Peripheral vascular disease, unspecified: Secondary | ICD-10-CM

## 2023-07-24 NOTE — Telephone Encounter (Signed)
 Copied from CRM [id number]. Topic: Clinical - Prescription Issue >> Jul 24, 2023  3:15 PM Kaylee Carpenter wrote: Reason for CRM: Patient called in regarding torsemide  (DEMADEX ) 20 MG tablet, informed her that it is not part of her current medication list , she stated she wasn't sure why because she needs her water pills, would like for a nurse to give her a callback regarding if she could get a renew prescription

## 2023-07-29 ENCOUNTER — Other Ambulatory Visit: Payer: Self-pay | Admitting: Family

## 2023-07-29 MED ORDER — TORSEMIDE 20 MG PO TABS: 20.0000 mg | ORAL_TABLET | Freq: Every day | ORAL | 0 refills | Status: DC

## 2023-08-07 ENCOUNTER — Ambulatory Visit (INDEPENDENT_AMBULATORY_CARE_PROVIDER_SITE_OTHER): Admitting: Family Medicine

## 2023-08-07 ENCOUNTER — Encounter: Payer: Self-pay | Admitting: Family Medicine

## 2023-08-07 VITALS — BP 125/80 | HR 73 | Temp 98.3°F | Ht 64.5 in | Wt 292.0 lb

## 2023-08-07 DIAGNOSIS — M1711 Unilateral primary osteoarthritis, right knee: Secondary | ICD-10-CM | POA: Diagnosis not present

## 2023-08-07 DIAGNOSIS — E119 Type 2 diabetes mellitus without complications: Secondary | ICD-10-CM | POA: Diagnosis not present

## 2023-08-07 DIAGNOSIS — G4733 Obstructive sleep apnea (adult) (pediatric): Secondary | ICD-10-CM

## 2023-08-07 DIAGNOSIS — Z7985 Long-term (current) use of injectable non-insulin antidiabetic drugs: Secondary | ICD-10-CM

## 2023-08-07 DIAGNOSIS — E66813 Obesity, class 3: Secondary | ICD-10-CM

## 2023-08-07 DIAGNOSIS — Z6841 Body Mass Index (BMI) 40.0 and over, adult: Secondary | ICD-10-CM

## 2023-08-07 DIAGNOSIS — R140 Abdominal distension (gaseous): Secondary | ICD-10-CM | POA: Diagnosis not present

## 2023-08-07 MED ORDER — SEMAGLUTIDE(0.25 OR 0.5MG/DOS) 2 MG/3ML ~~LOC~~ SOPN: 0.2500 mg | PEN_INJECTOR | SUBCUTANEOUS | 0 refills | Status: DC

## 2023-08-07 NOTE — Patient Instructions (Signed)
 Cut out 'sugar free' sweets- read labels for sugar alcohols 'erythritol, xylitol, maltitol' -- these can be very bloating  Hydrate well with water  Referral to [facility] for sleep study  Meal replacement: Whey protein isolate powder (20-30 g of protein) Unsweetened almond milk  A handful frozen fruit (any) Handful of spinach or a teaspoon of chia sees for fiber Ice to thicken  Start on Ozempic  0.25 mg once a week injection  Begin with 15 min of walking daily

## 2023-08-07 NOTE — Progress Notes (Signed)
 Office: [phone]  /  Fax: [phone]  WEIGHT SUMMARY AND BIOMETRICS  Starting Date: 04/25/23  Starting Weight: 310lb   Weight Lost Since Last Visit: 2lb   Vitals Temp: 98.3 F (36.8 C) BP: 125/80 Pulse Rate: 73 SpO2: 98 %   Body Composition  Body Fat %: 45.7 % Fat Mass (lbs): 133.6 lbs Muscle Mass (lbs): 150.6 lbs Total Body Water (lbs): 109.4 lbs Visceral Fat Rating : 19    HPI  Chief Complaint: OBESITY  Kaylee Carpenter is here to discuss her progress with her obesity treatment plan. She is on the the Category 2 Plan and states she is following her eating plan approximately 89 % of the time. She states she is exercising 0 minutes 0 times per week.  Interval History:  Since last office visit she is down 2 lb  She is up 3.2 lb of muscle mass and down 5.2 lb of body fat since last visit She has not yet started Ozempic  She has a net weight loss of 18 lb in 3 mos This is a 5.8% TBW loss She has done well with meal planning and eating on a schedule She has a good support system She is able to do a little more activity with R knee arthritis She has been tired and has yet to be called for sleep medicine referral  Pharmacotherapy: none  PHYSICAL EXAM:  Blood pressure 125/80, pulse 73, temperature 98.3 F (36.8 C), height 5' 4.5 (1.638 m), weight 292 lb (132.5 kg), SpO2 98%. Body mass index is 49.35 kg/m.  General: She is overweight, cooperative, alert, well developed, and in no acute distress. PSYCH: Has normal mood, affect and thought process.   Lungs: Normal breathing effort, no conversational dyspnea.   ASSESSMENT AND PLAN  TREATMENT PLAN FOR OBESITY:  Recommended Dietary Goals  Kaylee Carpenter is currently in the action stage of change. As such, her goal is to continue weight management plan. She has agreed to the Category 2 Plan. Reviewed dietary change goals on AVS  Behavioral Intervention  We discussed the following Behavioral Modification Strategies  today: increasing lean protein intake to established goals, increasing fiber rich foods, increasing water intake , work on meal planning and preparation, reading food labels , keeping healthy foods at home, work on managing stress, creating time for self-care and relaxation, avoiding temptations and identifying enticing environmental cues, and continue to work on maintaining a reduced calorie state, getting the recommended amount of protein, incorporating whole foods, making healthy choices, staying well hydrated and practicing mindfulness when eating..  Additional resources provided today: NA  Recommended Physical Activity Goals  Kaylee Carpenter has been advised to work up to 150 minutes of moderate intensity aerobic activity a week and strengthening exercises 2-3 times per week for cardiovascular health, weight loss maintenance and preservation of muscle mass.   She has agreed to Think about enjoyable ways to increase daily physical activity and overcoming barriers to exercise and Increase physical activity in their day and reduce sedentary time (increase NEAT). Increase walking time to 15 min daily  Pharmacotherapy changes for the treatment of obesity: begin Ozempic  0.25 mg weekly  ASSOCIATED CONDITIONS ADDRESSED TODAY  OSA (obstructive sleep apnea) She notes a hx of moderate to severe OSA, off CPAP x 5 years with daytime sleepiness and weight gain.  Referral to lung and sleep wellness made Continue active plan for weight loss with a goal of 8 hrs of sleep at night -     Pulmonary Visit  Type 2 diabetes  mellitus without complication, without long-term current use of insulin  [facility]) Lab Results  Component Value Date   HGBA1C 6.4 02/26/2023  Insurance did approve Ozempic  but she has yet to start it She is doing well with >5% TBW loss in 3 mos of medically supervised weight management on a low sugar reduced kcal diet -     Semaglutide (0.25 or 0.5MG /DOS); Inject 0.25 mg into the skin once a week. Use  once weekly as directed  Dispense: 3 mL; Refill: 0  Bloating Denies constipation After review of food intake, this appears to be due to intake of sugar free ice cream.  Both dairy and sugar alcohols can be bloating. She agrees to stopping this and intake of other foods that have sugar alcohols like sugar free candy  Primary osteoarthritis of right knee Actively working on weight loss / BMI reduction for R knee arthroplasty in the near future Begin 15 min of walking daily as knee pain has improved  Class 3 severe obesity due to excess calories with body mass index (BMI) of 45.0 to 49.9 in adult      She was informed of the importance of frequent follow up visits to maximize her success with intensive lifestyle modifications for her multiple health conditions.   ATTESTASTION STATEMENTS:  Reviewed by clinician on day of visit: allergies, medications, problem list, medical history, surgical history, family history, social history, and previous encounter notes pertinent to obesity diagnosis.   I have personally spent 30 minutes total time today in preparation, patient care, nutritional counseling and education,  and documentation for this visit, including the following: review of most recent clinical lab tests, prescribing medications/ refilling medications, reviewing medical assistant documentation, review and interpretation of bioimpedence results.     Kaylee Carpenter, D.O. DABFM, DABOM [facility] [address] [place], [place] [address] [phone]

## 2023-08-07 NOTE — Telephone Encounter (Signed)
 Prior authorization was approved for Ozempic .  Message from Plan Request Reference Number: [id number]. OZEMPIC  INJ 2MG /3ML is approved through 07/09/2024. Your patient may now fill this prescription and it will be covered.. Authorization Expiration Date: July 09, 2024.

## 2023-08-27 ENCOUNTER — Telehealth: Payer: Self-pay | Admitting: Family Medicine

## 2023-08-27 NOTE — Telephone Encounter (Signed)
 Patient stated she called [facility], and they do not have a referral for Adnan Javaid, MD, specializing in Sleep Medicine, Critical Care Medicine, and Pulmonary Disease. They are currently waiting for the referral and requested that it be faxed to [phone]. Thank you!

## 2023-08-28 NOTE — Telephone Encounter (Signed)
 Refaxed t o Dr. Javaid

## 2023-09-02 ENCOUNTER — Encounter: Payer: Self-pay | Admitting: Family Medicine

## 2023-09-02 ENCOUNTER — Ambulatory Visit (INDEPENDENT_AMBULATORY_CARE_PROVIDER_SITE_OTHER): Admitting: Family Medicine

## 2023-09-02 VITALS — BP 104/67 | HR 73 | Temp 98.6°F | Ht 64.5 in | Wt 286.0 lb

## 2023-09-02 DIAGNOSIS — G4733 Obstructive sleep apnea (adult) (pediatric): Secondary | ICD-10-CM | POA: Diagnosis not present

## 2023-09-02 DIAGNOSIS — M1711 Unilateral primary osteoarthritis, right knee: Secondary | ICD-10-CM | POA: Diagnosis not present

## 2023-09-02 DIAGNOSIS — E66813 Obesity, class 3: Secondary | ICD-10-CM

## 2023-09-02 DIAGNOSIS — E119 Type 2 diabetes mellitus without complications: Secondary | ICD-10-CM | POA: Diagnosis not present

## 2023-09-02 DIAGNOSIS — Z6841 Body Mass Index (BMI) 40.0 and over, adult: Secondary | ICD-10-CM

## 2023-09-02 NOTE — Progress Notes (Signed)
 Office: [phone]  /  Fax: [phone]  WEIGHT SUMMARY AND BIOMETRICS  Starting Date: 04/25/23  Starting Weight: 310lb   Weight Lost Since Last Visit: 6lb   Vitals Temp: 98.6 F (37 C) BP: 104/67 Pulse Rate: 73 SpO2: 98 %   Body Composition  Body Fat %: 47 % Fat Mass (lbs): 134.8 lbs Muscle Mass (lbs): 144.2 lbs Total Body Water (lbs): 108.8 lbs Visceral Fat Rating : 19    HPI  Chief Complaint: OBESITY  Kaylee Carpenter is here to discuss her progress with her obesity treatment plan. She is on the the Category 2 Plan and states she is following her eating plan approximately 80 % of the time. She states she is exercising 0 minutes 0 times per week.  Interval History:  Since last office visit she is down 6 lb This giving her a net weight loss of 24 lb in the past 4 mos of medically supervised weight management This is  a 7.7% TBW loss She has limited starches and sugar She is mostly sticking to her cat 2 meal plan She is getting in some fruits and veggies She has a good support system at home and at work She has not yet seen Dr Thurlow for her sleep study (seeing them 8/19) R knee pain and fatigue limits her walking time  Pharmacotherapy: did not start ozempic   PHYSICAL EXAM:  Blood pressure 104/67, pulse 73, temperature 98.6 F (37 C), height 5' 4.5 (1.638 m), weight 286 lb (129.7 kg), SpO2 98%. Body mass index is 48.33 kg/m.  General: She is overweight, cooperative, alert, well developed, and in no acute distress. PSYCH: Has normal mood, affect and thought process.   Lungs: Normal breathing effort, no conversational dyspnea.   ASSESSMENT AND PLAN  TREATMENT PLAN FOR OBESITY:  Recommended Dietary Goals  Kaylee Carpenter is currently in the action stage of change. As such, her goal is to continue weight management plan. She has agreed to the Category 2 Plan.  Behavioral Intervention  We discussed the following Behavioral Modification Strategies today:  increasing lean protein intake to established goals, increasing vegetables, increasing water intake , work on meal planning and preparation, keeping healthy foods at home, practice mindfulness eating and understand the difference between hunger signals and cravings, avoiding temptations and identifying enticing environmental cues, continue to practice mindfulness when eating, planning for success, and continue to work on maintaining a reduced calorie state, getting the recommended amount of protein, incorporating whole foods, making healthy choices, staying well hydrated and practicing mindfulness when eating..  Additional resources provided today: NA  Recommended Physical Activity Goals  Kaylee Carpenter has been advised to work up to 150 minutes of moderate intensity aerobic activity a week and strengthening exercises 2-3 times per week for cardiovascular health, weight loss maintenance and preservation of muscle mass.   She has agreed to Think about enjoyable ways to increase daily physical activity and overcoming barriers to exercise and Increase physical activity in their day and reduce sedentary time (increase NEAT).  Pharmacotherapy changes for the treatment of obesity: none (she has decided to Hold her RX for Ozempic )  ASSOCIATED CONDITIONS ADDRESSED TODAY  Primary osteoarthritis of right knee R knee pain continues to cause her daily pain limiting her ability to walk for exercise.  This has hindered her weight loss progress.  On voltaren  gel 4 mg 4 x a day prn.  Continue to work on BMI reduction for future arthroplasty  Class 3 severe obesity due to excess calories with body mass  index (BMI) of 45.0 to 49.9 in adult Improving Continue prescribed diet Consider water exercise given knee OA  Type 2 diabetes mellitus without complication, without long-term current use of insulin  (HCC) Lab Results  Component Value Date   HGBA1C 6.4 02/26/2023   She has been fearful of adding Ozempic  though she  did pick up the 0.25 mg dose We discussed use of metformin.  Will consider adding if A1c is not improving in the next month. Check A1c here if not done by PCP next month Doing great with weight reduction and a reduced kcal low sugar/ lower starch diet  OSA (obstructive sleep apnea) Has upcoming visit with Dr Javaid for PSG Hx of OSA, not on CPAP Continue to work on sleep hygiene and weight reduction     She was informed of the importance of frequent follow up visits to maximize her success with intensive lifestyle modifications for her multiple health conditions.   ATTESTASTION STATEMENTS:  Reviewed by clinician on day of visit: allergies, medications, problem list, medical history, surgical history, family history, social history, and previous encounter notes pertinent to obesity diagnosis.   I have personally spent 30 minutes total time today in preparation, patient care, nutritional counseling and education,  and documentation for this visit, including the following: review of most recent clinical lab tests, prescribing medications/ refilling medications, reviewing medical assistant documentation, review and interpretation of bioimpedence results.     Kaylee Carpenter, D.O. DABFM, DABOM [facility] [address] [place], [place] [address] [phone]

## 2023-09-02 NOTE — Progress Notes (Signed)
 Office: [phone]  /  Fax: [phone]  WEIGHT SUMMARY AND BIOMETRICS  Starting Date: 04/25/23  Starting Weight: 310lb   Weight Lost Since Last Visit: 6lb   Vitals Temp: 98.6 F (37 C) BP: 104/67 Pulse Rate: 73 SpO2: 98 %   Body Composition  Body Fat %: 47 % Fat Mass (lbs): 134.8 lbs Muscle Mass (lbs): 144.2 lbs Total Body Water (lbs): 108.8 lbs Visceral Fat Rating : 19    HPI  Chief Complaint: OBESITY  Kaylee Carpenter is here to discuss her progress with her obesity treatment plan. She is on the the Category 2 Plan and states she is following her eating plan approximately 80 % of the time. She states she is exercising 0 minutes 0 times per week.  Interval History:  Since last office visit she is down 6 lb This giving her a net weight loss of 24 lb in the past 4 mos of medically supervised weight management This is  a 7.7% TBW loss She has limited starches and sugar She is mostly sticking to her cat 2 meal plan She is getting in some fruits and veggies She has a good support system at home and at work She has not yet seen Dr Thurlow for her sleep study (seeing them 8/19) R knee pain and fatigue limits her walking time  Pharmacotherapy: did not start ozempic   PHYSICAL EXAM:  Blood pressure 104/67, pulse 73, temperature 98.6 F (37 C), height 5' 4.5 (1.638 m), weight 286 lb (129.7 kg), SpO2 98%. Body mass index is 48.33 kg/m.  General: She is overweight, cooperative, alert, well developed, and in no acute distress. PSYCH: Has normal mood, affect and thought process.   Lungs: Normal breathing effort, no conversational dyspnea.   ASSESSMENT AND PLAN  TREATMENT PLAN FOR OBESITY:  Recommended Dietary Goals  Kaylee Carpenter is currently in the action stage of change. As such, her goal is to continue weight management plan. She has agreed to the Category 2 Plan.  Behavioral Intervention  We discussed the following Behavioral Modification Strategies today:  increasing lean protein intake to established goals, increasing vegetables, increasing water intake , work on meal planning and preparation, keeping healthy foods at home, practice mindfulness eating and understand the difference between hunger signals and cravings, avoiding temptations and identifying enticing environmental cues, continue to practice mindfulness when eating, planning for success, and continue to work on maintaining a reduced calorie state, getting the recommended amount of protein, incorporating whole foods, making healthy choices, staying well hydrated and practicing mindfulness when eating..  Additional resources provided today: NA  Recommended Physical Activity Goals  Kaylee Carpenter has been advised to work up to 150 minutes of moderate intensity aerobic activity a week and strengthening exercises 2-3 times per week for cardiovascular health, weight loss maintenance and preservation of muscle mass.   She has agreed to Think about enjoyable ways to increase daily physical activity and overcoming barriers to exercise and Increase physical activity in their day and reduce sedentary time (increase NEAT).  Pharmacotherapy changes for the treatment of obesity: none (she has decided to Hold her RX for Ozempic )  ASSOCIATED CONDITIONS ADDRESSED TODAY  Primary osteoarthritis of right knee R knee pain continues to cause her daily pain limiting her ability to walk for exercise.  This has hindered her weight loss progress.  On voltaren  gel 4 mg 4 x a day prn.  Continue to work on BMI reduction for future arthroplasty  Class 3 severe obesity due to excess calories with body mass  index (BMI) of 45.0 to 49.9 in adult Improving Continue prescribed diet Consider water exercise given knee OA  Type 2 diabetes mellitus without complication, without long-term current use of insulin  (HCC) Lab Results  Component Value Date   HGBA1C 6.4 02/26/2023   She has been fearful of adding Ozempic  though she  did pick up the 0.25 mg dose We discussed use of metformin.  Will consider adding if A1c is not improving in the next month. Check A1c here if not done by PCP next month Doing great with weight reduction and a reduced kcal low sugar/ lower starch diet  OSA (obstructive sleep apnea) Has upcoming visit with Dr Javaid for PSG Hx of OSA, not on CPAP Continue to work on sleep hygiene and weight reduction     She was informed of the importance of frequent follow up visits to maximize her success with intensive lifestyle modifications for her multiple health conditions.   ATTESTASTION STATEMENTS:  Reviewed by clinician on day of visit: allergies, medications, problem list, medical history, surgical history, family history, social history, and previous encounter notes pertinent to obesity diagnosis.   I have personally spent 30 minutes total time today in preparation, patient care, nutritional counseling and education,  and documentation for this visit, including the following: review of most recent clinical lab tests, prescribing medications/ refilling medications, reviewing medical assistant documentation, review and interpretation of bioimpedence results.     Darice Haddock, D.O. DABFM, DABOM [facility] [address] [place], [place] [address] [phone]

## 2023-09-02 NOTE — Addendum Note (Signed)
 Addended by: WAYLAN DARICE BRAVO on: 09/02/2023 04:32 PM   Modules accepted: Level of Service

## 2023-09-12 ENCOUNTER — Other Ambulatory Visit: Payer: Self-pay | Admitting: Nurse Practitioner

## 2023-09-12 DIAGNOSIS — I10 Essential (primary) hypertension: Secondary | ICD-10-CM

## 2023-09-18 ENCOUNTER — Ambulatory Visit (INDEPENDENT_AMBULATORY_CARE_PROVIDER_SITE_OTHER)

## 2023-09-18 VITALS — Ht 64.0 in | Wt 286.0 lb

## 2023-09-18 DIAGNOSIS — Z0000 Encounter for general adult medical examination without abnormal findings: Secondary | ICD-10-CM | POA: Diagnosis not present

## 2023-09-18 DIAGNOSIS — K635 Polyp of colon: Secondary | ICD-10-CM | POA: Diagnosis not present

## 2023-09-18 DIAGNOSIS — R7303 Prediabetes: Secondary | ICD-10-CM | POA: Diagnosis not present

## 2023-09-18 NOTE — Progress Notes (Signed)
 Subjective:   Kaylee Carpenter is a 68 y.o. who presents for a Medicare Wellness preventive visit.  As a reminder, Annual Wellness Visits don't include a physical exam, and some assessments may be limited, especially if this visit is performed virtually. We may recommend an in-person follow-up visit with your provider if needed.  Visit Complete: Virtual I connected with  Kaylee Carpenter on 09/18/23 by a audio enabled telemedicine application and verified that I am speaking with the correct person using two identifiers.  Patient Location: Home  Provider Location: Office/Clinic  I discussed the limitations of evaluation and management by telemedicine. The patient expressed understanding and agreed to proceed.  Vital Signs: Because this visit was a virtual/telehealth visit, some criteria may be missing or patient reported. Any vitals not documented were not able to be obtained and vitals that have been documented are patient reported.  VideoDeclined- This patient declined Librarian, academic. Therefore the visit was completed with audio only.  Persons Participating in Visit: Patient.  AWV Questionnaire: No: Patient Medicare AWV questionnaire was not completed prior to this visit.  Cardiac Risk Factors include: advanced age (>29men, >59 women);dyslipidemia;hypertension;obesity (BMI >30kg/m2)     Objective:    Today's Vitals   09/18/23 1013  Weight: 286 lb (129.7 kg)  Height: 5' 4 (1.626 m)   Body mass index is 49.09 kg/m.     09/18/2023   10:13 AM 09/06/2022    6:17 AM 06/22/2022    9:08 AM 03/22/2016    7:23 AM 03/21/2016   11:03 AM 02/24/2016    8:06 AM 08/23/2014   11:43 AM  Advanced Directives  Does Patient Have a Medical Advance Directive? No No No No  No  No  No   Would patient like information on creating a medical advance directive? Yes (MAU/Ambulatory/Procedural Areas - Information given) No - Patient declined No - Patient declined  Yes  (MAU/Ambulatory/Procedural Areas - Information given)        Data saved with a previous flowsheet row definition    Current Medications (verified) Outpatient Encounter Medications as of 09/18/2023  Medication Sig   acetaminophen  (TYLENOL ) 500 MG tablet Take 500-1,000 mg by mouth every 8 (eight) hours as needed.   aspirin 81 MG tablet Take 81 mg by mouth daily.   carvedilol  (COREG ) 6.25 MG tablet TAKE 1 TABLET(6.25 MG) BY MOUTH TWICE DAILY WITH A MEAL   cetirizine  (ZYRTEC ) 10 MG tablet Take 1 tablet (10 mg total) by mouth daily.   cholecalciferol (VITAMIN D ) 1000 units tablet Take 10,000 Units by mouth daily.   diazepam (VALIUM) 10 MG tablet Take by mouth.   diclofenac  Sodium (VOLTAREN  ARTHRITIS PAIN) 1 % GEL Apply 4 g topically 4 (four) times daily.   ibuprofen  (ADVIL ) 400 MG tablet Take 400 mg by mouth as needed.   lidocaine  4 % Place 1 patch onto the skin daily.   mupirocin  ointment (BACTROBAN ) 2 % APPLY TOPICALLY TO THE AFFECTED AREA TWICE DAILY   Omega-3 Fatty Acids (FISH OIL) 1000 MG CAPS Take by mouth.   Semaglutide ,0.25 or 0.5MG /DOS, 2 MG/3ML SOPN Inject 0.25 mg into the skin once a week. Use once weekly as directed   spironolactone  (ALDACTONE ) 25 MG tablet TAKE 1 TABLET(25 MG) BY MOUTH DAILY   torsemide  (DEMADEX ) 20 MG tablet Take 1 tablet (20 mg total) by mouth daily.   No facility-administered encounter medications on file as of 09/18/2023.    Allergies (verified) Latex   History: Past Medical History:  Diagnosis Date   Arthritis    in her back had a shot in march 2024. 08/28/2022   Back pain    Edema    High cholesterol    Hypertension    Hyperthyroidism 2007   Graves Disease   Joint pain    OSA on CPAP    doesn't use CPAP anymore and doesn't have it . 08/28/2022   PVD (peripheral vascular disease) (HCC)    see Dr. KYM Blade   Shortness of breath    due to edema and weight   Sleep apnea    uses a CPAP   Wears dentures    upper 08/28/2022   Wears partial  dentures    lower . 08/28/2022   Past Surgical History:  Procedure Laterality Date   CHOLECYSTECTOMY  1985   COLONOSCOPY WITH PROPOFOL  N/A 02/24/2016   Procedure: COLONOSCOPY WITH PROPOFOL ;  Surgeon: Belvie Just, MD;  Location: [facility];  Service: Endoscopy;  Laterality: N/A;   DILATATION & CURETTAGE/HYSTEROSCOPY WITH MYOSURE N/A 03/22/2016   Procedure: DILATATION & CURETTAGE/HYSTEROSCOPY WITH MYOSURE;  Surgeon: Dickie Carder, MD;  Location: [facility];  Service: Gynecology;  Laterality: N/A;   DILATATION & CURETTAGE/HYSTEROSCOPY WITH MYOSURE N/A 09/06/2022   Procedure: DILATATION & CURETTAGE/HYSTEROSCOPY;  Surgeon: Laurence Slater PARAS, MD;  Location: [facility];  Service: Gynecology;  Laterality: N/A;   DILATATION & CURRETTAGE/HYSTEROSCOPY WITH RESECTOCOPE N/A 09/17/2013   Procedure: DILATATION & CURETTAGE, HYSTEROSCOPY, CYSTOSCOPY;  Surgeon: Jon CINDERELLA Rummer, MD;  Location: [facility];  Service: Gynecology;  Laterality: N/A;   DILATION AND CURETTAGE OF UTERUS     INTRAUTERINE DEVICE (IUD) INSERTION N/A 09/06/2022   Procedure: INTRAUTERINE DEVICE (IUD) INSERTION;  Surgeon: Laurence Slater PARAS, MD;  Location: [facility];  Service: Gynecology;  Laterality: N/A;   Family History  Problem Relation Age of Onset   Cancer Mother    Thyroid  disease Mother    Diabetes Mother    Hypertension Mother    Obesity Mother    Cancer Father    Hypertension Father    Colon cancer Father    Social History   Socioeconomic History   Marital status: Divorced    Spouse name: Not on file   Number of children: 2   Years of education: BS   Highest education level: Not on file  Occupational History   Not on file  Tobacco Use   Smoking status: Never   Smokeless tobacco: Never  Vaping Use   Vaping status: Never Used  Substance and Sexual Activity   Alcohol use: No   Drug use: No   Sexual activity: Not Currently    Birth control/protection: Post-menopausal  Other Topics Concern   Not  on file  Social History Narrative   Drinks about 1-2 sodas a week.    Social Drivers of Corporate investment banker Strain: Low Risk  (09/18/2023)   Overall Financial Resource Strain (CARDIA)    Difficulty of Paying Living Expenses: Not hard at all  Food Insecurity: No Food Insecurity (09/18/2023)   Hunger Vital Sign    Worried About Running Out of Food in the Last Year: Never true    Ran Out of Food in the Last Year: Never true  Transportation Needs: No Transportation Needs (09/18/2023)   PRAPARE - Administrator, Civil Service (Medical): No    Lack of Transportation (Non-Medical): No  Physical Activity: Inactive (09/18/2023)   Exercise Vital Sign    Days of Exercise per Week:  0 days    Minutes of Exercise per Session: 0 min  Stress: Stress Concern Present (09/18/2023)   [employer] of Occupational Health - Occupational Stress Questionnaire    Feeling of Stress: To some extent  Social Connections: Moderately Integrated (09/18/2023)   Social Connection and Isolation Panel    Frequency of Communication with Friends and Family: Never    Frequency of Social Gatherings with Friends and Family: More than three times a week    Attends Religious Services: More than 4 times per year    Active Member of [employer] or Organizations: Yes    Attends Engineer, structural: More than 4 times per year    Marital Status: Divorced    Tobacco Counseling Counseling given: Not Answered    Clinical Intake:  Pre-visit preparation completed: Yes  Pain : No/denies pain     BMI - recorded: 49.09 Nutritional Status: BMI > 30  Obese Nutritional Risks: None Diabetes: No  Lab Results  Component Value Date   HGBA1C 6.4 02/26/2023   HGBA1C 6.6 (H) 10/25/2022   HGBA1C 6.2 07/16/2022     How often do you need to have someone help you when you read instructions, pamphlets, or other written materials from your doctor or pharmacy?: 1 - Never  Interpreter Needed?:  No  Information entered by :: Verdie Saba, CMA   Activities of Daily Living     09/18/2023   10:16 AM  In your present state of health, do you have any difficulty performing the following activities:  Hearing? 0  Vision? 0  Difficulty concentrating or making decisions? 0  Walking or climbing stairs? 0  Dressing or bathing? 0  Doing errands, shopping? 0  Preparing Food and eating ? N  Using the Toilet? N  In the past six months, have you accidently leaked urine? N  Do you have problems with loss of bowel control? N  Managing your Medications? N  Managing your Finances? N  Housekeeping or managing your Housekeeping? N    Patient Care Team: Elnor Lauraine BRAVO, NP as PCP - General (Nurse Practitioner)  I have updated your Care Teams any recent Medical Services you may have received from other providers in the past year.     Assessment:   This is a routine wellness examination for Kaylee Carpenter.  Hearing/Vision screen Hearing Screening - Comments:: Denies hearing difficulties   Vision Screening - Comments:: Wears rx glasses - up to date with routine eye exams with [facility]    Goals Addressed               This Visit's Progress     Patient Stated (pt-stated)        Patient stated she plans to lose weight (about 50lbs) and planning to have right knee surgery       Depression Screen     09/18/2023   10:17 AM 04/04/2023    3:49 PM 11/09/2022    3:58 PM 10/25/2022    8:31 AM 10/04/2022    4:24 PM 06/22/2022    9:12 AM 06/22/2022    9:08 AM  PHQ 2/9 Scores  PHQ - 2 Score 0 0 0 0 0 0 0  PHQ- 9 Score 0          Fall Risk     09/18/2023   10:16 AM 04/04/2023    3:49 PM 11/09/2022    3:57 PM 10/25/2022    8:31 AM 10/04/2022    4:24 PM  Fall Risk  Falls in the past year? 0 0 0 0 0  Number falls in past yr: 0 0 0 0   Injury with Fall? 0 0 0 0 0  Risk for fall due to : No Fall Risks No Fall Risks No Fall Risks No Fall Risks No Fall Risks  Follow up Falls evaluation  completed;Falls prevention discussed Falls evaluation completed Falls evaluation completed Falls evaluation completed Falls evaluation completed    MEDICARE RISK AT HOME:  Medicare Risk at Home Any stairs in or around the home?: No If so, are there any without handrails?: No Home free of loose throw rugs in walkways, pet beds, electrical cords, etc?: Yes Adequate lighting in your home to reduce risk of falls?: Yes Life alert?: No Use of a cane, walker or w/c?: No Grab bars in the bathroom?: No Shower chair or bench in shower?: Yes Elevated toilet seat or a handicapped toilet?: Yes  TIMED UP AND GO:  Was the test performed?  No  Cognitive Function: 6CIT completed        09/18/2023   10:19 AM 06/22/2022    9:18 AM  6CIT Screen  What Year? 0 points 0 points  What month? 0 points 0 points  What time? 0 points 0 points  Count back from 20 0 points 0 points  Months in reverse 0 points 0 points  Repeat phrase 0 points 0 points  Total Score 0 points 0 points    Immunizations Immunization History  Administered Date(s) Administered   Fluad Quad(high Dose 65+) 04/07/2021   Influenza-Unspecified 01/04/2022   PNEUMOCOCCAL CONJUGATE-20 08/25/2021    Screening Tests Health Maintenance  Topic Date Due   FOOT EXAM  Never done   OPHTHALMOLOGY EXAM  Never done   Diabetic kidney evaluation - Urine ACR  Never done   Zoster Vaccines- Shingrix (1 of 2) Never done   Colonoscopy  02/23/2021   INFLUENZA VACCINE  08/23/2023   HEMOGLOBIN A1C  08/26/2023   Diabetic kidney evaluation - eGFR measurement  04/03/2024   Medicare Annual Wellness (AWV)  09/17/2024   MAMMOGRAM  04/15/2025   Pneumococcal Vaccine: 50+ Years  Completed   DEXA SCAN  Completed   Hepatitis C Screening  Completed   HPV VACCINES  Aged Out   Meningococcal B Vaccine  Aged Out   DTaP/Tdap/Td  Discontinued   COVID-19 Vaccine  Discontinued    Health Maintenance  Health Maintenance Due  Topic Date Due   FOOT EXAM   Never done   OPHTHALMOLOGY EXAM  Never done   Diabetic kidney evaluation - Urine ACR  Never done   Zoster Vaccines- Shingrix (1 of 2) Never done   Colonoscopy  02/23/2021   INFLUENZA VACCINE  08/23/2023   HEMOGLOBIN A1C  08/26/2023   Health Maintenance Items Addressed:  Referral sent to GI for colonoscopy, Labs Ordered: Diabetic Kidney Urine ACR & Hemoglobin A1C  Additional Screening:  Vision Screening: Recommended annual ophthalmology exams for early detection of glaucoma and other disorders of the eye. Would you like a referral to an eye doctor? No  Patient stated has had eye exam in early 2025 at [facility].    Dental Screening: Recommended annual dental exams for proper oral hygiene  Community Resource Referral / Chronic Care Management: CRR required this visit?  No   CCM required this visit?  No   Plan:    I have personally reviewed and noted the following in the patient's chart:   Medical and social history Use of alcohol,  tobacco or illicit drugs  Current medications and supplements including opioid prescriptions. Patient is not currently taking opioid prescriptions. Functional ability and status Nutritional status Physical activity Advanced directives List of other physicians Hospitalizations, surgeries, and ER visits in previous 12 months Vitals Screenings to include cognitive, depression, and falls Referrals and appointments  In addition, I have reviewed and discussed with patient certain preventive protocols, quality metrics, and best practice recommendations. A written personalized care plan for preventive services as well as general preventive health recommendations were provided to patient.   Verdie CHRISTELLA Saba, CMA   09/18/2023   After Visit Summary: (MyChart) Due to this being a telephonic visit, the after visit summary with patients personalized plan was offered to patient via MyChart   Notes: Scheduled a 5-mth f/u w/PCP for 10/2023.

## 2023-09-18 NOTE — Patient Instructions (Addendum)
 Kaylee Carpenter , Thank you for taking time out of your busy schedule to complete your Annual Wellness Visit with me. I enjoyed our conversation and look forward to speaking with you again next year. I, as well as your care team,  appreciate your ongoing commitment to your health goals. Please review the following plan we discussed and let me know if I can assist you in the future. Your Game plan/ To Do List    Referrals: If you haven't heard from the office you've been referred to, please reach out to them at the phone provided. Ordered a Hemoglobin A1C and Diabetic Kidney Urine (will see Provider first in 10/2023).  Follow up Visits: We will see or speak with you next year for your Next Medicare AWV with our clinical staff Have you seen your provider in the last 6 months (3 months if uncontrolled diabetes)? Yes  Clinician Recommendations:  Aim for 30 minutes of exercise or brisk walking, 6-8 glasses of water, and 5 servings of fruits and vegetables each day.       This is a list of the screenings recommended for you:  Health Maintenance  Topic Date Due   Complete foot exam   Never done   Eye exam for diabetics  Never done   Yearly kidney health urinalysis for diabetes  Never done   Zoster (Shingles) Vaccine (1 of 2) Never done   Colon Cancer Screening  02/23/2021   Flu Shot  08/23/2023   Hemoglobin A1C  08/26/2023   Yearly kidney function blood test for diabetes  04/03/2024   Medicare Annual Wellness Visit  09/17/2024   Mammogram  04/15/2025   Pneumococcal Vaccine for age over 72  Completed   DEXA scan (bone density measurement)  Completed   Hepatitis C Screening  Completed   HPV Vaccine  Aged Out   Meningitis B Vaccine  Aged Out   DTaP/Tdap/Td vaccine  Discontinued   COVID-19 Vaccine  Discontinued    Advanced directives: (Provided) Advance directive discussed with you today. I have provided a copy for you to complete at home and have notarized. Once this is complete, please bring a  copy in to our office so we can scan it into your chart.  Advance Care Planning is important because it:  [x]  Makes sure you receive the medical care that is consistent with your values, goals, and preferences  [x]  It provides guidance to your family and loved ones and reduces their decisional burden about whether or not they are making the right decisions based on your wishes.  Follow the link provided in your after visit summary or read over the paperwork we have mailed to you to help you started getting your Advance Directives in place. If you need assistance in completing these, please reach out to us  so that we can help you!

## 2023-10-01 ENCOUNTER — Ambulatory Visit (INDEPENDENT_AMBULATORY_CARE_PROVIDER_SITE_OTHER): Admitting: Family Medicine

## 2023-10-01 ENCOUNTER — Encounter: Payer: Self-pay | Admitting: Family Medicine

## 2023-10-01 VITALS — BP 137/81 | HR 68 | Temp 98.1°F | Ht 64.0 in | Wt 287.0 lb

## 2023-10-01 DIAGNOSIS — G4733 Obstructive sleep apnea (adult) (pediatric): Secondary | ICD-10-CM

## 2023-10-01 DIAGNOSIS — E66813 Obesity, class 3: Secondary | ICD-10-CM

## 2023-10-01 DIAGNOSIS — M1711 Unilateral primary osteoarthritis, right knee: Secondary | ICD-10-CM

## 2023-10-01 DIAGNOSIS — E119 Type 2 diabetes mellitus without complications: Secondary | ICD-10-CM | POA: Diagnosis not present

## 2023-10-01 DIAGNOSIS — Z6841 Body Mass Index (BMI) 40.0 and over, adult: Secondary | ICD-10-CM

## 2023-10-01 NOTE — Patient Instructions (Addendum)
 Check out Silver Sneakers with your Medicare plan Check out your local [employer] for options  Consider: Ask about working out with a trainer Water exercise Exercise bike  Goal: 2 days/ /wk of doing something for exercise  Keep upcoming visit for sleep study  Watch out for added calories : sauces/ dressings

## 2023-10-01 NOTE — Progress Notes (Signed)
 Office: [phone]  /  Fax: [phone]  WEIGHT SUMMARY AND BIOMETRICS  Starting Date: 04/25/23  Starting Weight: 310lb   Weight Lost Since Last Visit: 0lb   Vitals Temp: 98.1 F (36.7 C) BP: 137/81 Pulse Rate: 68 SpO2: 98 %   Body Composition  Body Fat %: 46.2 % Fat Mass (lbs): 132.6 lbs Muscle Mass (lbs): 146.8 lbs Total Body Water (lbs): 106.4 lbs Visceral Fat Rating : 19     HPI  Chief Complaint: OBESITY  Kaylee Carpenter is here to discuss her progress with her obesity treatment plan. She is on the the Category 2 Plan and states she is following her eating plan approximately 90-95 % of the time. She states she is exercising 0 minutes 0 times per week.   Interval History:  Since last office visit she is up 1 lb She did increase muscle mass 2.6 lb and is down 2.2 lb of body fat She is getting some walking in at work She has a net weight loss of 23 lb in 5 mos of medically supervised weight management This is a 7.4% TBW loss She has struggled to hydrate well She is not doing any formal exercise She did cut back on bacon intake Walking is limited by R knee pain PSG is scheduled for this Thursday  Pharmacotherapy: none, declined new start Ozempic   PHYSICAL EXAM:  Blood pressure 137/81, pulse 68, temperature 98.1 F (36.7 C), height 5' 4 (1.626 m), weight 287 lb (130.2 kg), SpO2 98%. Body mass index is 49.26 kg/m.  General: She is overweight, cooperative, alert, well developed, and in no acute distress. PSYCH: Has normal mood, affect and thought process.   Lungs: Normal breathing effort, no conversational dyspnea.   ASSESSMENT AND PLAN  TREATMENT PLAN FOR OBESITY:  Recommended Dietary Goals  Kaylee Carpenter is currently in the action stage of change. As such, her goal is to continue weight management plan. She has agreed to the Category 2 Plan.  Behavioral Intervention  We discussed the following Behavioral Modification Strategies today: increasing lean  protein intake to established goals, increasing fiber rich foods, avoiding skipping meals, increasing water intake , work on meal planning and preparation, reading food labels , keeping healthy foods at home, and continue to practice mindfulness when eating. Watch our for condiments, dipping sauces, dressings  Additional resources provided today: NA  Recommended Physical Activity Goals  Kaylee Carpenter has been advised to work up to 150 minutes of moderate intensity aerobic activity a week and strengthening exercises 2-3 times per week for cardiovascular health, weight loss maintenance and preservation of muscle mass.   She has agreed to Start aerobic activity with a goal of 150 minutes a week at moderate intensity.  We discussed the PREP program vs using [facility] and options for a trainer, water exercise, exercise bike given the limitations of R knee pain  Pharmacotherapy changes for the treatment of obesity: none  ASSOCIATED CONDITIONS ADDRESSED TODAY  Type 2 diabetes mellitus without complication, without long-term current use of insulin  (HCC) Lab Results  Component Value Date   HGBA1C 6.4 02/26/2023  She has yet to start her RX for Ozempic  and held off on starting metformin as well She has lost 7.4% TBW loss in 5 mos of medically supervised weight management She has cut back on added sugar and starch intake She has room for more regular exercise Labs will be done at her upcoming PCP visit in October  Class 3 severe obesity due to excess  calories with body mass index (BMI) of 45.0 to 49.9 in adult Improving but has hit a halt in weight loss She is slowly improving body composition based on bioimpedence, reviewed results w/ her today Her slow down is likely due to lack of regular exercise and untreated OSA  Primary osteoarthritis of right knee Unchanged.  She is using Voltaren  gel prn knee pain She is limited with walking due to pain which has affected her weight  loss progress Asked her about PT/ personal trainer/ water exercise options to improve strength around knee and reduce pain  OSA (obstructive sleep apnea) She is awaiting a sleep study with Dr Javaid's office Notes reviewed from 09/10/23 Continue to work on sleep hygiene and weight reduction    She was informed of the importance of frequent follow up visits to maximize her success with intensive lifestyle modifications for her multiple health conditions.   ATTESTASTION STATEMENTS:  Reviewed by clinician on day of visit: allergies, medications, problem list, medical history, surgical history, family history, social history, and previous encounter notes pertinent to obesity diagnosis.   I have personally spent 30 minutes total time today in preparation, patient care, nutritional counseling and education,  and documentation for this visit, including the following: review of most recent clinical lab tests, prescribing medications/ refilling medications, reviewing medical assistant documentation, review and interpretation of bioimpedence results.     Darice Haddock, D.O. DABFM, DABOM [facility] [address] [place], [place] [address] [phone]

## 2023-10-15 ENCOUNTER — Ambulatory Visit: Payer: Self-pay

## 2023-10-15 NOTE — Telephone Encounter (Signed)
 FYI Only or Action Required?: FYI only for provider.  Patient was last seen in primary care on 10/01/2023 by Waylan Darice BRAVO, DO.  Called Nurse Triage reporting Insect Bite.  Symptoms began a week ago.  Interventions attempted: OTC medications: topical cortisone cream.  Symptoms are: gradually worsening.  Triage Disposition: See Physician Within 24 Hours  Patient/caregiver understands and will follow disposition?: Yes  Copied from CRM #[id number]. Topic: Clinical - Red Word Triage >> Oct 15, 2023 12:47 PM Drema MATSU wrote: Red Word that prompted transfer to Nurse Triage: Patient states that something bit her last week. She states that it was small but now blister is size of a quarter. Reason for Disposition  [1] Red or very tender (to touch) area AND [2] started over 24 hours after the bite  Answer Assessment - Initial Assessment Questions 1. TYPE of INSECT: What type of insect was it?      unsure 2. ONSET: When did you get bitten?      A week ago 3. LOCATION: Where is the insect bite located?      Right lower leg near ankle 4. REDNESS: Is the area red or pink? If Yes, ask: What size is the area of redness? (inches or cm). When did the redness start?     denies 5. PAIN: Is there any pain? If Yes, ask: How bad is the pain? (Scale 0-10; or none, mild, moderate, severe)     Pain controlled with cortisone 6. ITCHING: Does it itch? If Yes, ask: How bad is the itch?      Itchy at the beginning, but cortisone helps 7. SWELLING: How big is the swelling? (e.g., inches, cm, or compare to coins)     Quarter sized blister 8. OTHER SYMPTOMS: Do you have any other symptoms?  (e.g., difficulty breathing, fever, hives)     denies 9. PREGNANCY: Is there any chance you are pregnant? When was your last menstrual period?     N/a  Protocols used: Insect Bite-A-AH

## 2023-10-17 ENCOUNTER — Ambulatory Visit: Admitting: Nurse Practitioner

## 2023-11-07 ENCOUNTER — Ambulatory Visit (INDEPENDENT_AMBULATORY_CARE_PROVIDER_SITE_OTHER): Admitting: Nurse Practitioner

## 2023-11-07 VITALS — BP 112/78 | HR 64 | Temp 97.8°F | Ht 64.0 in | Wt 286.2 lb

## 2023-11-07 DIAGNOSIS — E1169 Type 2 diabetes mellitus with other specified complication: Secondary | ICD-10-CM | POA: Diagnosis not present

## 2023-11-07 DIAGNOSIS — M170 Bilateral primary osteoarthritis of knee: Secondary | ICD-10-CM

## 2023-11-07 DIAGNOSIS — E66813 Obesity, class 3: Secondary | ICD-10-CM

## 2023-11-07 DIAGNOSIS — E785 Hyperlipidemia, unspecified: Secondary | ICD-10-CM | POA: Diagnosis not present

## 2023-11-07 DIAGNOSIS — R7303 Prediabetes: Secondary | ICD-10-CM

## 2023-11-07 DIAGNOSIS — E559 Vitamin D deficiency, unspecified: Secondary | ICD-10-CM | POA: Diagnosis not present

## 2023-11-07 DIAGNOSIS — Z7985 Long-term (current) use of injectable non-insulin antidiabetic drugs: Secondary | ICD-10-CM | POA: Diagnosis not present

## 2023-11-07 DIAGNOSIS — Z6841 Body Mass Index (BMI) 40.0 and over, adult: Secondary | ICD-10-CM

## 2023-11-07 DIAGNOSIS — Z1211 Encounter for screening for malignant neoplasm of colon: Secondary | ICD-10-CM

## 2023-11-07 LAB — LIPID PANEL
Cholesterol: 204 mg/dL — ABNORMAL HIGH (ref 0–200)
HDL: 54.6 mg/dL (ref >39.00)
LDL Cholesterol: 124 mg/dL — ABNORMAL HIGH (ref 0–99)
NonHDL: 149.14
Total CHOL/HDL Ratio: 4
Triglycerides: 124 mg/dL (ref 0.0–149.0)
VLDL: 24.8 mg/dL (ref 0.0–40.0)

## 2023-11-07 LAB — COMPREHENSIVE METABOLIC PANEL WITH GFR
ALT: 17 U/L (ref 0–35)
AST: 18 U/L (ref 0–37)
Albumin: 4.4 g/dL (ref 3.5–5.2)
Alkaline Phosphatase: 92 U/L (ref 39–117)
BUN: 25 mg/dL — ABNORMAL HIGH (ref 6–23)
CO2: 31 meq/L (ref 19–32)
Calcium: 10.9 mg/dL — ABNORMAL HIGH (ref 8.4–10.5)
Chloride: 99 meq/L (ref 96–112)
Creatinine, Ser: 1.06 mg/dL (ref 0.40–1.20)
GFR: 53.35 mL/min — ABNORMAL LOW (ref >60.00)
Glucose, Bld: 87 mg/dL (ref 70–99)
Potassium: 4.1 meq/L (ref 3.5–5.1)
Sodium: 136 meq/L (ref 135–145)
Total Bilirubin: 0.3 mg/dL (ref 0.2–1.2)
Total Protein: 8.1 g/dL (ref 6.0–8.3)

## 2023-11-07 LAB — HEMOGLOBIN A1C: Hgb A1c MFr Bld: 6.7 % — ABNORMAL HIGH (ref 4.6–6.5)

## 2023-11-07 LAB — MICROALBUMIN / CREATININE URINE RATIO
Creatinine,U: 133.6 mg/dL
Microalb Creat Ratio: 5.9 mg/g (ref 0.0–30.0)
Microalb, Ur: 0.8 mg/dL (ref 0.0–1.9)

## 2023-11-07 LAB — VITAMIN D 25 HYDROXY (VIT D DEFICIENCY, FRACTURES): VITD: 43.9 ng/mL (ref 30.00–100.00)

## 2023-11-07 MED ORDER — LANCETS MISC
1.0000 | 11 refills | Status: AC
Start: 2023-11-07 — End: ?

## 2023-11-07 MED ORDER — BLOOD GLUCOSE TEST VI STRP: 1.0000 | ORAL_STRIP | 11 refills | Status: AC

## 2023-11-07 MED ORDER — RYBELSUS 3 MG PO TABS: 3.0000 mg | ORAL_TABLET | Freq: Every day | ORAL | 0 refills | Status: DC

## 2023-11-07 MED ORDER — BLOOD GLUCOSE MONITORING SUPPL DEVI: 1.0000 | 0 refills | Status: AC

## 2023-11-07 MED ORDER — LANCET DEVICE MISC
1.0000 | 0 refills | Status: AC
Start: 2023-11-07 — End: ?

## 2023-11-07 NOTE — Assessment & Plan Note (Signed)
 Knee osteoarthritis Chronic condition affecting mobility. Weight loss part of management plan for potential surgery. - Issue a handicap placard due to mobility issues related to knee osteoarthritis.

## 2023-11-07 NOTE — Progress Notes (Signed)
 Established Patient Office Visit  Subjective   Patient ID: Kaylee Carpenter, female    DOB: 1955/07/07  Age: 68 y.o. MRN: [id number]  Chief Complaint  Patient presents with   Diabetes    Discussed the use of AI scribe software for clinical note transcription with the patient, who gave verbal consent to proceed.  History of Present Illness Kaylee Carpenter is a 68 year old female who presents for a follow-up   Vitamin d  deficiency and hypercalcemia - Low serum vitamin D  and high calcium  levels - Currently taking 10,000 IU of vitamin D  daily - Follow-up laboratory testing has not been completed due to scheduling issues  Type 2 diabetes with hyperlipidemia, Obesity and weight management - Weight decreased from approximately 316 pounds to 286 pounds - Weight loss has plateaued for about two months - Prescribed Ozempic  for weight loss but has not initiated therapy due to concerns about long-term use and weekly injections - Prefers oral medication for weight management -A1C peaked at 6.6 in 2024, she is currently diet controlled -Last LDL 128, on asa 81mg /day and fish oil. Not on statin therapy.   Musculoskeletal pain - Arthritis affecting knees and back - Waiting to undergo total knee replacement but needs to lose weight per orthopedist prior to surgery       Review of Systems  Respiratory:  Negative for cough and shortness of breath.   Cardiovascular:  Negative for chest pain and palpitations.      Objective:     BP 112/78   Pulse 64   Temp 97.8 F (36.6 C) (Temporal)   Ht 5' 4 (1.626 m)   Wt 286 lb 4 oz (129.8 kg)   SpO2 98%   BMI 49.13 kg/m  BP Readings from Last 3 Encounters:  11/07/23 112/78  10/01/23 137/81  09/02/23 104/67   Wt Readings from Last 3 Encounters:  11/07/23 286 lb 4 oz (129.8 kg)  10/01/23 287 lb (130.2 kg)  09/18/23 286 lb (129.7 kg)      Physical Exam Vitals reviewed.  Constitutional:      General: She is not in acute  distress.    Appearance: Normal appearance.  HENT:     Head: Normocephalic and atraumatic.  Cardiovascular:     Rate and Rhythm: Normal rate and regular rhythm.     Pulses: Normal pulses.     Heart sounds: Normal heart sounds.  Pulmonary:     Effort: Pulmonary effort is normal.     Breath sounds: Normal breath sounds.  Skin:    General: Skin is warm and dry.  Neurological:     General: No focal deficit present.     Mental Status: She is alert and oriented to person, place, and time.  Psychiatric:        Mood and Affect: Mood normal.        Behavior: Behavior normal.        Judgment: Judgment normal.      No results found for any visits on 11/07/23.    The 10-year ASCVD risk score (Arnett DK, et al., 2019) is: 22.7%    Assessment & Plan:   Problem List Items Addressed This Visit       Endocrine   Type 2 diabetes mellitus with hyperlipidemia (HCC) - Primary   Obesity associated with type 2 diabetes and hyperlipidemia - Prescribe Rybelsus  3 mg orally once daily, increase to 7 mg after one month. Patient prefers oral medication over injection.  - Order  glucometer for home blood glucose monitoring. - Discuss potential side effects of Rybelsus , including nausea, upset stomach, constipation, diarrhea, and black box warning of thyroid  cancer. - Advise to report severe side effects such as vomiting, severe abdominal pain, or symptoms of thyroid  issues. Type 2 diabetes mellitus Rybelsus  prescribed for diabetes management and potential weight loss. Emphasized importance of blood glucose monitoring. - Order A1c test with next lab work. - Instruct to monitor blood glucose daily before breakfast or a few times a week. - Advise to report symptoms of hypoglycemia such as headache, nausea, cold sweats. -Check updated lipid panel -Discuss statin therapy at follow-up      Relevant Medications   Semaglutide  (RYBELSUS ) 3 MG TABS   Blood Glucose Monitoring Suppl DEVI   Glucose Blood  (BLOOD GLUCOSE TEST STRIPS) STRP   Lancet Device MISC   Lancets MISC   Other Relevant Orders   Lipid panel   Comprehensive metabolic panel with GFR     Musculoskeletal and Integument   Primary osteoarthritis of both knees   Knee osteoarthritis Chronic condition affecting mobility. Weight loss part of management plan for potential surgery. - Issue a handicap placard due to mobility issues related to knee osteoarthritis.        Other   Obesity   Obesity associated with type 2 diabetes and hyperlipidemia - Prescribe Rybelsus  3 mg orally once daily, increase to 7 mg after one month. Patient prefers oral medication over injection.  - Order glucometer for home blood glucose monitoring. - Discuss potential side effects of Rybelsus , including nausea, upset stomach, constipation, diarrhea, and black box warning of thyroid  cancer. - Advise to report severe side effects such as vomiting, severe abdominal pain, or symptoms of thyroid  issues. Type 2 diabetes mellitus Rybelsus  prescribed for diabetes management and potential weight loss. Emphasized importance of blood glucose monitoring. - Order A1c test with next lab work. - Instruct to monitor blood glucose daily before breakfast or a few times a week. - Advise to report symptoms of hypoglycemia such as headache, nausea, cold sweats. -Check updated lipid panel -Discuss statin therapy at follow-up      Relevant Medications   Semaglutide  (RYBELSUS ) 3 MG TABS   Vitamin D  deficiency   Vitamin D  deficiency and hypercalcemia Ongoing vitamin D  supplementation. Follow-up labs pending due to scheduling issues. - Continue Vitamin D3 10,000 IU daily. - Advise to return for follow-up labs to check vitamin D  and calcium  levels.      Colon cancer screening   Refer to GI      Relevant Orders   Ambulatory referral to Gastroenterology   Hypercalcemia   Vitamin D  deficiency and hypercalcemia Ongoing vitamin D  supplementation. Follow-up labs pending  due to scheduling issues. - Continue Vitamin D3 10,000 IU daily. - Advise to return for follow-up labs to check vitamin D  and calcium  levels.      RESOLVED: Prediabetes   Assessment and Plan Assessment & Plan Obesity associated with type 2 diabetes and hyperlipidemia - Prescribe Rybelsus  3 mg orally once daily, increase to 7 mg after one month. Patient prefers oral medication over injection.  - Order glucometer for home blood glucose monitoring. - Discuss potential side effects of Rybelsus , including nausea, upset stomach, constipation, diarrhea, and black box warning of thyroid  cancer. - Advise to report severe side effects such as vomiting, severe abdominal pain, or symptoms of thyroid  issues. Type 2 diabetes mellitus Rybelsus  prescribed for diabetes management and potential weight loss. Emphasized importance of blood glucose monitoring. - Order  A1c test with next lab work. - Instruct to monitor blood glucose daily before breakfast or a few times a week. - Advise to report symptoms of hypoglycemia such as headache, nausea, cold sweats. -Check updated lipid panel -Discuss statin therapy at follow-up  Vitamin D  deficiency and hypercalcemia Ongoing vitamin D  supplementation. Follow-up labs pending due to scheduling issues. - Continue Vitamin D3 10,000 IU daily. - Advise to return for follow-up labs to check vitamin D  and calcium  levels.  Knee osteoarthritis Chronic condition affecting mobility. Weight loss part of management plan for potential surgery. - Issue a handicap placard due to mobility issues related to knee osteoarthritis.   Return in about 3 months (around 02/07/2024) for F/U with Arbie Reisz.    Lauraine FORBES Pereyra, NP

## 2023-11-07 NOTE — Assessment & Plan Note (Signed)
 Vitamin D  deficiency and hypercalcemia Ongoing vitamin D  supplementation. Follow-up labs pending due to scheduling issues. - Continue Vitamin D3 10,000 IU daily. - Advise to return for follow-up labs to check vitamin D  and calcium  levels.

## 2023-11-07 NOTE — Assessment & Plan Note (Signed)
 Refer to GI

## 2023-11-07 NOTE — Assessment & Plan Note (Signed)
 Obesity associated with type 2 diabetes and hyperlipidemia - Prescribe Rybelsus  3 mg orally once daily, increase to 7 mg after one month. Patient prefers oral medication over injection.  - Order glucometer for home blood glucose monitoring. - Discuss potential side effects of Rybelsus , including nausea, upset stomach, constipation, diarrhea, and black box warning of thyroid  cancer. - Advise to report severe side effects such as vomiting, severe abdominal pain, or symptoms of thyroid  issues. Type 2 diabetes mellitus Rybelsus  prescribed for diabetes management and potential weight loss. Emphasized importance of blood glucose monitoring. - Order A1c test with next lab work. - Instruct to monitor blood glucose daily before breakfast or a few times a week. - Advise to report symptoms of hypoglycemia such as headache, nausea, cold sweats. -Check updated lipid panel -Discuss statin therapy at follow-up

## 2023-11-09 LAB — PTH, INTACT AND CALCIUM
Calcium: 10.8 mg/dL — ABNORMAL HIGH (ref 8.6–10.4)
PTH: 103 pg/mL — ABNORMAL HIGH (ref 16–77)

## 2023-11-10 ENCOUNTER — Ambulatory Visit: Payer: Self-pay | Admitting: Nurse Practitioner

## 2023-11-10 ENCOUNTER — Other Ambulatory Visit: Payer: Self-pay | Admitting: Nurse Practitioner

## 2023-11-10 DIAGNOSIS — E213 Hyperparathyroidism, unspecified: Secondary | ICD-10-CM

## 2023-11-11 ENCOUNTER — Ambulatory Visit (INDEPENDENT_AMBULATORY_CARE_PROVIDER_SITE_OTHER): Admitting: Family Medicine

## 2023-11-11 VITALS — BP 118/77 | HR 66 | Temp 98.0°F | Ht 64.0 in | Wt 288.0 lb

## 2023-11-11 DIAGNOSIS — E119 Type 2 diabetes mellitus without complications: Secondary | ICD-10-CM | POA: Diagnosis not present

## 2023-11-11 DIAGNOSIS — E66813 Obesity, class 3: Secondary | ICD-10-CM

## 2023-11-11 DIAGNOSIS — G4733 Obstructive sleep apnea (adult) (pediatric): Secondary | ICD-10-CM | POA: Diagnosis not present

## 2023-11-11 DIAGNOSIS — Z6841 Body Mass Index (BMI) 40.0 and over, adult: Secondary | ICD-10-CM

## 2023-11-11 DIAGNOSIS — M1711 Unilateral primary osteoarthritis, right knee: Secondary | ICD-10-CM

## 2023-11-11 DIAGNOSIS — Z7984 Long term (current) use of oral hypoglycemic drugs: Secondary | ICD-10-CM

## 2023-11-11 NOTE — Progress Notes (Signed)
 Office: [phone]  /  Fax: [phone]  WEIGHT SUMMARY AND BIOMETRICS  Starting Date: 04/25/23  Starting Weight: 310lb   Weight Lost Since Last Visit: 0lb   Vitals Temp: 98 F (36.7 C) BP: 118/77 Pulse Rate: 66 SpO2: 98 %   Body Composition  Body Fat %: 57.2 % Fat Mass (lbs): 164.8 lbs Muscle Mass (lbs): 117.2 lbs Visceral Fat Rating : 23    HPI  Chief Complaint: OBESITY  Kaylee Carpenter is here to discuss her progress with her obesity treatment plan. She is on the the Category 2 Plan and states she is following her eating plan approximately 90 % of the time. She states she is exercising 20 minutes 3 times per week.  Interval History:  Since last office visit she is up 1 lb She did go to the fair this weekend and indulged in a donut She has a good support system This gives her a net weight loss of 22 lb in the past 6 mos of medically supervised weight management This is a 7% TBW loss She plans to R TKR done in the early Spring She is getting used to her CPAP and feels better rest She added in chair exercise 3 days/ wk She has not been <280 lb in > 5 years  Pharmacotherapy: none  PHYSICAL EXAM:  Blood pressure 118/77, pulse 66, temperature 98 F (36.7 C), height 5' 4 (1.626 m), weight 288 lb (130.6 kg), SpO2 98%. Body mass index is 49.44 kg/m.  General: She is overweight, cooperative, alert, well developed, and in no acute distress. PSYCH: Has normal mood, affect and thought process.   Lungs: Normal breathing effort, no conversational dyspnea.   ASSESSMENT AND PLAN  TREATMENT PLAN FOR OBESITY:  Recommended Dietary Goals  Kaylee Carpenter is currently in the action stage of change. As such, her goal is to continue weight management plan. She has agreed to the Category 2 Plan.  Behavioral Intervention  We discussed the following Behavioral Modification Strategies today: increasing lean protein intake to established goals, avoiding skipping meals, increasing  water intake , work on meal planning and preparation, work on Counselling psychologist calories using tracking application, keeping healthy foods at home, practice mindfulness eating and understand the difference between hunger signals and cravings, work on managing stress, creating time for self-care and relaxation, avoiding temptations and identifying enticing environmental cues, and planning for success.  Additional resources provided today: NA  Recommended Physical Activity Goals  Kaylee Carpenter has been advised to work up to 150 minutes of moderate intensity aerobic activity a week and strengthening exercises 2-3 times per week for cardiovascular health, weight loss maintenance and preservation of muscle mass.   She has agreed to Think about enjoyable ways to increase daily physical activity and overcoming barriers to exercise and Increase physical activity in their day and reduce sedentary time (increase NEAT). Great job adding in chair exercises!  Pharmacotherapy changes for the treatment of obesity: none, she will be starting Rybelsus  this week with her PCP  ASSOCIATED CONDITIONS ADDRESSED TODAY  Primary osteoarthritis of right knee Unchanged Bilateral knee pain has hindered her ability to walk for exercise She is actively working on BMI reduction for R TKR in the Spring Continue prescribed meal plan with chair exercises 3+ days/ wk  Class 3 severe obesity due to excess calories with serious comorbidity and body mass index (BMI) of 45.0 to 49.9 in adult [facility] Overall, improving Reviewed a plan to get off weight loss plateau in this next month  OSA on CPAP She has recently started CPAP at night, improving daytime sleepiness and energy levels Look for improvements in weight reduction Aim for 7-8 hrs of high quality sleep at night  Type 2 diabetes mellitus without complication, without long-term current use of insulin  [facility]) Lab Results  Component Value Date   HGBA1C 6.7 (H) 11/07/2023    She did agree to RX Rybelsus  with her PCP after declining injectable GLP-1s in the past She is working on reducing starch and sugar intake with weight loss.  Will look for improvements in blood sugars and further weight reduction w/ the addition of Rybelsus      She was informed of the importance of frequent follow up visits to maximize her success with intensive lifestyle modifications for her multiple health conditions.   ATTESTASTION STATEMENTS:  Reviewed by clinician on day of visit: allergies, medications, problem list, medical history, surgical history, family history, social history, and previous encounter notes pertinent to obesity diagnosis.   I have personally spent 30 minutes total time today in preparation, patient care, nutritional counseling and education,  and documentation for this visit, including the following: review of most recent clinical lab tests, prescribing medications/ refilling medications, reviewing medical assistant documentation, review and interpretation of bioimpedence results.     Darice Haddock, D.O. DABFM, DABOM [facility] [address] [place], [place] [address] [phone]

## 2023-11-14 ENCOUNTER — Other Ambulatory Visit: Payer: Self-pay | Admitting: Nurse Practitioner

## 2023-11-14 DIAGNOSIS — E213 Hyperparathyroidism, unspecified: Secondary | ICD-10-CM

## 2023-11-14 NOTE — Addendum Note (Signed)
 Addended by: ELNOR LAURAINE BRAVO on: 11/14/2023 08:10 AM   Modules accepted: Orders

## 2023-11-28 ENCOUNTER — Encounter: Payer: Self-pay | Admitting: Nurse Practitioner

## 2023-12-02 ENCOUNTER — Telehealth: Payer: Self-pay

## 2023-12-02 NOTE — Telephone Encounter (Signed)
 Copied from CRM [id number]. Topic: Clinical - Medication Question >> Dec 02, 2023  2:50 PM Viola F wrote: Patient says she was told to call and schedule virtual visit to discuss new cholesterol medication - the next available isn't until January and she wants to know if she needs to be seen or if the new medication can be sent to the pharmacy? Pleas call her at [phone] (M)

## 2023-12-10 ENCOUNTER — Encounter: Payer: Self-pay | Admitting: Family Medicine

## 2023-12-10 ENCOUNTER — Ambulatory Visit (INDEPENDENT_AMBULATORY_CARE_PROVIDER_SITE_OTHER): Admitting: Family Medicine

## 2023-12-10 VITALS — BP 114/67 | HR 66 | Temp 98.6°F | Ht 64.0 in | Wt 284.0 lb

## 2023-12-10 DIAGNOSIS — Z7984 Long term (current) use of oral hypoglycemic drugs: Secondary | ICD-10-CM

## 2023-12-10 DIAGNOSIS — E66813 Obesity, class 3: Secondary | ICD-10-CM

## 2023-12-10 DIAGNOSIS — E1169 Type 2 diabetes mellitus with other specified complication: Secondary | ICD-10-CM

## 2023-12-10 DIAGNOSIS — M1711 Unilateral primary osteoarthritis, right knee: Secondary | ICD-10-CM

## 2023-12-10 DIAGNOSIS — E785 Hyperlipidemia, unspecified: Secondary | ICD-10-CM | POA: Diagnosis not present

## 2023-12-10 DIAGNOSIS — Z6841 Body Mass Index (BMI) 40.0 and over, adult: Secondary | ICD-10-CM

## 2023-12-10 MED ORDER — RYBELSUS 3 MG PO TABS: 3.0000 mg | ORAL_TABLET | Freq: Every day | ORAL | 0 refills | Status: DC

## 2023-12-10 NOTE — Progress Notes (Signed)
 Office: [phone]  /  Fax: [phone]  WEIGHT SUMMARY AND BIOMETRICS  Starting Date: 04/25/23  Starting Weight: 310lb   Weight Lost Since Last Visit: 4lb   Vitals Temp: 98.6 F (37 C) BP: 114/67 Pulse Rate: 66 SpO2: 98 %   Body Composition  Body Fat %: 57.1 % Fat Mass (lbs): 162.2 lbs Muscle Mass (lbs): 115.8 lbs Visceral Fat Rating : 23    HPI  Chief Complaint: OBESITY  Kaylee Carpenter is here to discuss her progress with her obesity treatment plan. She is on the the Category 2 Plan and states she is following her eating plan approximately 80 % of the time. She states she is exercising 15 minutes 7 times per week.  Interval History:  Since last office visit she is down 4 lb This given her a net weight loss of 26 lb in 7 mos of medically supervised weight management This is an 8.3% total body weight loss She did start on Rybelsus  with her PCP - tolerating well She denies meal skipping or GI upset She allows a 'cheat meal' once a month She has a good support system She is getting in some fruits and vegies She is getting used to her CPAP nasal pillows She has a glucometer to check her blood sugars She has been doing some regular exercise, limited by right knee pain  Pharmacotherapy: Rybelsus  3 mg once daily  PHYSICAL EXAM:  Blood pressure 114/67, pulse 66, temperature 98.6 F (37 C), height 5' 4 (1.626 m), weight 284 lb (128.8 kg), SpO2 98%. Body mass index is 48.75 kg/m.  General: She is overweight, cooperative, alert, well developed, and in no acute distress. PSYCH: Has normal mood, affect and thought process.   Lungs: Normal breathing effort, no conversational dyspnea.  ASSESSMENT AND PLAN  TREATMENT PLAN FOR OBESITY:  Recommended Dietary Goals  Kaylee Carpenter is currently in the action stage of change. As such, her goal is to continue weight management plan. She has agreed to the Category 2 Plan. Minimize snacks at work to 2/day including things like  100-calorie nut packs, fresh fruit, Greek yogurt  Behavioral Intervention  We discussed the following Behavioral Modification Strategies today: increasing lean protein intake to established goals, increasing fiber rich foods, increasing water intake , work on meal planning and preparation, reading food labels , continue to practice mindfulness when eating, and celebration eating strategies.  Additional resources provided today: NA  Recommended Physical Activity Goals  Kaylee Carpenter has been advised to work up to 150 minutes of moderate intensity aerobic activity a week and strengthening exercises 2-3 times per week for cardiovascular health, weight loss maintenance and preservation of muscle mass.   She has agreed to Think about enjoyable ways to increase daily physical activity and overcoming barriers to exercise, Increase physical activity in their day and reduce sedentary time (increase NEAT)., and Start strengthening exercises with a goal of 2-3 sessions a week  Consider adding in prep program for formal exercise  Pharmacotherapy changes for the treatment of obesity: None  ASSOCIATED CONDITIONS ADDRESSED TODAY  Type 2 diabetes mellitus with hyperlipidemia (HCC) Lab Results  Component Value Date   HGBA1C 6.7 (H) 11/07/2023  Started on Rybelsus  by her PCP.  She did not have any more refills.  She has been tolerating the 3 mg dose well without adverse side effect.  She denies feelings of hypoglycemia and is actively working on a low sugar/low starch diet.  She has room for improvement with regular exercise and is continuing to  see weight reduction.  Denies meal skipping or GI upset  -     Rybelsus ; Take 1 tablet (3 mg total) by mouth daily.  Dispense: 30 tablet; Refill: 0  Class 3 severe obesity due to excess calories with serious comorbidity and body mass index (BMI) of 45.0 to 49.9 in adult [facility]) Improving with an 8.3% total body weight loss in 7 months  Primary osteoarthritis of right  knee Stable.  She is using Tylenol  as needed for osteoarthritis pain and diclofenac  gel. Consider PT referral, water aerobics, recumbent bike use to ramp up exercise.     She was informed of the importance of frequent follow up visits to maximize her success with intensive lifestyle modifications for her multiple health conditions.   ATTESTASTION STATEMENTS:  Reviewed by clinician on day of visit: allergies, medications, problem list, medical history, surgical history, family history, social history, and previous encounter notes pertinent to obesity diagnosis.   I have personally spent 30 minutes total time today in preparation, patient care, nutritional counseling and education,  and documentation for this visit, including the following: review of most recent clinical lab tests, prescribing medications/ refilling medications, reviewing medical assistant documentation, review and interpretation of bioimpedence results.     Darice Haddock, D.O. DABFM, DABOM [facility] [address] [place], [place] [address] [phone]

## 2023-12-11 NOTE — Telephone Encounter (Signed)
 Called pt and schedule her for a follow appointment

## 2023-12-15 ENCOUNTER — Other Ambulatory Visit: Payer: Self-pay | Admitting: Family

## 2023-12-15 ENCOUNTER — Other Ambulatory Visit: Payer: Self-pay | Admitting: Nurse Practitioner

## 2023-12-15 DIAGNOSIS — E785 Hyperlipidemia, unspecified: Secondary | ICD-10-CM

## 2023-12-15 DIAGNOSIS — R9389 Abnormal findings on diagnostic imaging of other specified body structures: Secondary | ICD-10-CM

## 2023-12-15 DIAGNOSIS — I10 Essential (primary) hypertension: Secondary | ICD-10-CM

## 2023-12-15 DIAGNOSIS — R7303 Prediabetes: Secondary | ICD-10-CM

## 2023-12-15 DIAGNOSIS — Z6841 Body Mass Index (BMI) 40.0 and over, adult: Secondary | ICD-10-CM

## 2023-12-16 ENCOUNTER — Other Ambulatory Visit: Payer: Self-pay | Admitting: Family

## 2023-12-16 ENCOUNTER — Other Ambulatory Visit: Payer: Self-pay | Admitting: Nurse Practitioner

## 2023-12-16 DIAGNOSIS — I10 Essential (primary) hypertension: Secondary | ICD-10-CM

## 2023-12-16 DIAGNOSIS — E1169 Type 2 diabetes mellitus with other specified complication: Secondary | ICD-10-CM

## 2023-12-24 ENCOUNTER — Other Ambulatory Visit: Payer: Self-pay | Admitting: Nurse Practitioner

## 2023-12-24 DIAGNOSIS — E1169 Type 2 diabetes mellitus with other specified complication: Secondary | ICD-10-CM

## 2023-12-24 DIAGNOSIS — I10 Essential (primary) hypertension: Secondary | ICD-10-CM

## 2023-12-24 NOTE — Telephone Encounter (Unsigned)
 Copied from CRM #[id number]. Topic: Clinical - Medication Refill >> Dec 24, 2023 10:45 AM Zebedee SAUNDERS wrote: Medication: Semaglutide  (RYBELSUS ) 3 MG TABS, carvedilol  (COREG ) 6.25 MG tablet  Has the patient contacted their pharmacy? Yes (Agent: If no, request that the patient contact the pharmacy for the refill. If patient does not wish to contact the pharmacy document the reason why and proceed with request.) (Agent: If yes, when and what did the pharmacy advise?)  This is the patient's preferred pharmacy:  [facility] [phone] - [place], [place] - [address] [facility] [address] [place] [address] Phone: [phone] Fax: [phone]  Is this the correct pharmacy for this prescription? Yes If no, delete pharmacy and type the correct one.   Has the prescription been filled recently? Yes  Is the patient out of the medication? Yes  Has the patient been seen for an appointment in the last year OR does the patient have an upcoming appointment? Yes  Can we respond through MyChart? Yes  Agent: Please be advised that Rx refills may take up to 3 business days. We ask that you follow-up with your pharmacy.

## 2023-12-25 MED ORDER — CARVEDILOL 6.25 MG PO TABS: 6.2500 mg | ORAL_TABLET | Freq: Two times a day (BID) | ORAL | 1 refills | Status: AC

## 2023-12-25 MED ORDER — RYBELSUS 3 MG PO TABS: 3.0000 mg | ORAL_TABLET | Freq: Every day | ORAL | 0 refills | Status: DC

## 2023-12-26 ENCOUNTER — Telehealth: Admitting: Nurse Practitioner

## 2023-12-26 DIAGNOSIS — B9689 Other specified bacterial agents as the cause of diseases classified elsewhere: Secondary | ICD-10-CM

## 2023-12-26 DIAGNOSIS — E1169 Type 2 diabetes mellitus with other specified complication: Secondary | ICD-10-CM | POA: Diagnosis not present

## 2023-12-26 DIAGNOSIS — E785 Hyperlipidemia, unspecified: Secondary | ICD-10-CM | POA: Diagnosis not present

## 2023-12-26 DIAGNOSIS — J329 Chronic sinusitis, unspecified: Secondary | ICD-10-CM | POA: Diagnosis not present

## 2023-12-26 MED ORDER — RYBELSUS 7 MG PO TABS: 7.0000 mg | ORAL_TABLET | Freq: Every day | ORAL | 1 refills | Status: AC

## 2023-12-26 MED ORDER — DOXYCYCLINE HYCLATE 100 MG PO TABS: 100.0000 mg | ORAL_TABLET | Freq: Two times a day (BID) | ORAL | 0 refills | Status: DC

## 2023-12-26 MED ORDER — ROSUVASTATIN CALCIUM 5 MG PO TABS: 5.0000 mg | ORAL_TABLET | Freq: Every day | ORAL | 1 refills | Status: DC

## 2023-12-26 MED ORDER — RYBELSUS 7 MG PO TABS: 7.0000 mg | ORAL_TABLET | Freq: Every day | ORAL | 1 refills | Status: DC

## 2023-12-26 NOTE — Progress Notes (Signed)
   Established Patient Office Visit  An audio/visual tele-health visit was completed today for this patient. I connected with  Kaylee Carpenter on 12/26/23 utilizing audio/visual technology and verified that I am speaking with the correct person using two identifiers. The patient was located at their place of employment, and I was located at the office of [facility] at [facility] during the encounter. I discussed the limitations of evaluation and management by telemedicine. The patient expressed understanding and agreed to proceed.  '  Subjective   Patient ID: Kaylee Carpenter, female    DOB: [birth year]  Age: 68 y.o. MRN: [id number]  Chief Complaint  Patient presents with   Medical Management of Chronic Issues    Follow up on cholesterol     Discussed the use of AI scribe software for clinical note transcription with the patient, who gave verbal consent to proceed.  History of Present Illness Kaylee Carpenter is a 68 year old female with type 2 diabetes and hyperlipidemia who presents for follow-up on medication management and sinusitis symptoms.  T2DM - Type 2 diabetes mellitus. Last A1c 6.7 - Needle phobic - Previously on 3 mg dose of Rybelsus . Tolerating well.    Hyperlipidemia and statin intolerance - LDL measured at 124 mg/dL in October. - Currently takes only fish oil for cholesterol management. - Developed muscle aches on prior statin therapy, which was discontinued. - Has not tried rosuvastatin or pravastatin.  Upper respiratory symptoms - Onset of viral-type symptoms >2 months - More than two weeks of nasal drainage and productive cough. - DayQuil provided symptomatic improvement but was discontinued. - No antibiotic allergies.      ROS: see HPI    Objective:     There were no vitals taken for this visit.   Physical Exam Comprehensive physical exam not completed today as office visit was conducted remotely.  Patient appears well over  video.  Patient was alert and oriented, and appeared to have appropriate judgment.    No results found for any visits on 12/26/23.    The 10-year ASCVD risk score (Arnett DK, et al., 2019) is: 21.9%    Assessment & Plan:   Problem List Items Addressed This Visit       Endocrine   Type 2 diabetes mellitus with hyperlipidemia (HCC) - Primary   Relevant Medications   rosuvastatin (CRESTOR) 5 MG tablet   Semaglutide  (RYBELSUS ) 7 MG TABS   Other Relevant Orders   Lipid panel   Comprehensive metabolic panel with GFR   Hemoglobin A1c   Other Visit Diagnoses       Bacterial sinusitis       Relevant Medications   doxycycline (VIBRA-TABS) 100 MG tablet      Assessment and Plan Assessment & Plan Acute bacterial sinusitis Symptoms persisting over two weeks suggest secondary bacterial infection. - Prescribed doxycycline 100 mg orally twice daily for 10 days. - Advised to report gastrointestinal upset or diarrhea.  Type 2 diabetes mellitus Current Rybelsus  dose insufficient for glycemic control. - Continue Rybelsus  3 mg until supply exhausted, then increase to 7 mg. - Recheck blood sugars after dose adjustment.  Hyperlipidemia LDL cholesterol above target; previous statin therapy caused muscle aches. Preference for statin due to efficacy. - Prescribed rosuvastatin at lowest dose. - Instructed to discontinue if muscle aches occur. - Ordered fasting cholesterol panel for January 22nd appointment.   No follow-ups on file.    Lauraine FORBES Pereyra, NP

## 2023-12-27 ENCOUNTER — Telehealth: Admitting: Nurse Practitioner

## 2024-01-08 ENCOUNTER — Encounter: Payer: Self-pay | Admitting: Family Medicine

## 2024-01-08 ENCOUNTER — Ambulatory Visit (INDEPENDENT_AMBULATORY_CARE_PROVIDER_SITE_OTHER): Admitting: Family Medicine

## 2024-01-08 VITALS — BP 124/76 | HR 77 | Temp 98.0°F | Ht 64.0 in | Wt 282.0 lb

## 2024-01-08 DIAGNOSIS — M1711 Unilateral primary osteoarthritis, right knee: Secondary | ICD-10-CM | POA: Diagnosis not present

## 2024-01-08 DIAGNOSIS — Z6841 Body Mass Index (BMI) 40.0 and over, adult: Secondary | ICD-10-CM | POA: Diagnosis not present

## 2024-01-08 DIAGNOSIS — G4733 Obstructive sleep apnea (adult) (pediatric): Secondary | ICD-10-CM

## 2024-01-08 DIAGNOSIS — E785 Hyperlipidemia, unspecified: Secondary | ICD-10-CM | POA: Diagnosis not present

## 2024-01-08 DIAGNOSIS — E1169 Type 2 diabetes mellitus with other specified complication: Secondary | ICD-10-CM

## 2024-01-08 DIAGNOSIS — Z7984 Long term (current) use of oral hypoglycemic drugs: Secondary | ICD-10-CM

## 2024-01-08 NOTE — Progress Notes (Signed)
 Office: [phone]  /  Fax: [phone]  WEIGHT SUMMARY AND BIOMETRICS  Starting Date: 04/25/23  Starting Weight: 310lb   Weight Lost Since Last Visit: 2lb   Vitals Temp: 98 F (36.7 C) BP: 124/76 Pulse Rate: 77 SpO2: 96 %   Body Composition  Body Fat %: 58 % Fat Mass (lbs): 164 lbs Muscle Mass (lbs): 112.8 lbs Visceral Fat Rating : 23   HPI  Chief Complaint: OBESITY  Kaylee Carpenter is here to discuss her progress with her obesity treatment plan. She is on the the Category 2 Plan and states she is following her eating plan approximately 75 % of the time. She states she is exercising 0 minutes 0 times per week.  Interval History:  Since last office visit she is down 2 lb She has a net weight loss of 28 lb in 8 mos of medically supervised weight management This is a 9% TBW loss She has had a URI since her last visit She has had bilateral knee pain, slightly better with weight loss She denies sugar cravings She is tolerating Rybelsus  3 mg daily with plans to increase to 7 mg daily She has slight improvements with satiety She is staying off high sugar items She is working with DME company to get a new CPAP mask  Pharmacotherapy: Rybelsus  3 mg daily by PCP for T2DM  PHYSICAL EXAM:  Blood pressure 124/76, pulse 77, temperature 98 F (36.7 C), height 5' 4 (1.626 m), weight 282 lb (127.9 kg), SpO2 96%. Body mass index is 48.41 kg/m.  General: She is overweight, cooperative, alert, well developed, and in no acute distress. PSYCH: Has normal mood, affect and thought process.   Lungs: Normal breathing effort, no conversational dyspnea.  ASSESSMENT AND PLAN  TREATMENT PLAN FOR OBESITY:  Recommended Dietary Goals  Kaylee Carpenter is currently in the action stage of change. As such, her goal is to continue weight management plan. She has agreed to the Category 2 Plan.  Behavioral Intervention  We discussed the following Behavioral Modification Strategies today:  increasing lean protein intake to established goals, increasing fiber rich foods, increasing water intake , work on meal planning and preparation, keeping healthy foods at home, avoiding temptations and identifying enticing environmental cues, continue to practice mindfulness when eating, planning for success, and celebration eating strategies.  Additional resources provided today: NA  Recommended Physical Activity Goals  Kaylee Carpenter has been advised to work up to 150 minutes of moderate intensity aerobic activity a week and strengthening exercises 2-3 times per week for cardiovascular health, weight loss maintenance and preservation of muscle mass.   She has agreed to Think about enjoyable ways to increase daily physical activity and overcoming barriers to exercise and Increase physical activity in their day and reduce sedentary time (increase NEAT).  Pharmacotherapy changes for the treatment of obesity: none  ASSOCIATED CONDITIONS ADDRESSED TODAY  Type 2 diabetes mellitus with hyperlipidemia (HCC) Lab Results  Component Value Date   HGBA1C 6.7 (H) 11/07/2023   She is doing well on Rybelsus  alone at 3 mg daily, tolerating well Not routinely checking sugars Doing well with weight loss and has room for more consistent exercise Plans to increase Rybelsus  to 7 mg daily  Morbid obesity (HCC) -     Ambulatory referral to [facility] She agrees to ref to [facility] to get her started with exercise Continue cat 2 meal plan Reviewed her overall progress  BMI 45.0-49.9, adult (HCC)  Primary osteoarthritis of right knee R knee pain  improving but having L knee arthritis pain with weather changes Taking Tylenol  as needed for knee pain Plan to start with water exercise 2 days/ wk  OSA on CPAP She has contacted DME company to get a new CPAP mask Aim for 8 hrs of high quality sleep    She was informed of the importance of frequent follow up visits to maximize her success  with intensive lifestyle modifications for her multiple health conditions.   ATTESTASTION STATEMENTS:  Reviewed by clinician on day of visit: allergies, medications, problem list, medical history, surgical history, family history, social history, and previous encounter notes pertinent to obesity diagnosis.   I personally spent a total of 24 minutes in the care of the patient today including preparing to see the patient, getting/reviewing separately obtained history, performing a medically appropriate exam/evaluation, counseling and educating, and placing orders.    Kaylee Carpenter, D.O. DABFM, DABOM [facility] [address] [place], [place] [address] [phone]

## 2024-01-10 ENCOUNTER — Telehealth: Payer: Self-pay

## 2024-01-10 NOTE — Telephone Encounter (Signed)
 Copied from CRM #[id number]. Topic: Clinical - Medical Advice >> Jan 10, 2024 10:55 AM Eleanor C wrote: Reason for CRM: patient called regarding recent cold that she had. She took prescribed medication and got better but is starting to feel sick again. She was wondering if more medication could be called in or if she could speak with doctor. She stated her nose was very runny.

## 2024-01-14 ENCOUNTER — Ambulatory Visit: Admitting: Nurse Practitioner

## 2024-01-14 ENCOUNTER — Ambulatory Visit

## 2024-01-14 VITALS — BP 120/72 | HR 70 | Temp 97.7°F | Ht 64.0 in | Wt 285.5 lb

## 2024-01-14 DIAGNOSIS — J310 Chronic rhinitis: Secondary | ICD-10-CM

## 2024-01-14 DIAGNOSIS — R059 Cough, unspecified: Secondary | ICD-10-CM | POA: Diagnosis not present

## 2024-01-14 MED ORDER — AMOXICILLIN-POT CLAVULANATE 875-125 MG PO TABS: 1.0000 | ORAL_TABLET | Freq: Two times a day (BID) | ORAL | 0 refills | Status: DC

## 2024-01-14 MED ORDER — FLUTICASONE PROPIONATE 50 MCG/ACT NA SUSP: 1.0000 | Freq: Every day | NASAL | 6 refills | Status: DC

## 2024-01-14 NOTE — Assessment & Plan Note (Signed)
 Acute upper respiratory infection Persistent symptoms despite doxycycline . Possible new bacterial or viral infection. Clear lungs, but chest x-ray needed to exclude pneumonia. - Ordered chest x-ray to rule out pneumonia. - Prescribed Augmentin  1 tablet twice a day for 10 days. - Prescribed Flonase  nasal spray, 1-2 sprays once a day until symptoms improve. - Advised to use Flonase  at bedtime for nighttime symptoms. - Instructed to report if diarrhea recurs or symptoms do not improve.

## 2024-01-14 NOTE — Progress Notes (Signed)
 "  Established Patient Office Visit  Subjective   Patient ID: Kaylee Carpenter, female    DOB: 1955/05/05  Age: 68 y.o. MRN: [id number]  Chief Complaint  Patient presents with   Cough    Coughing and nasal congestion for a month now and also chest congestion, pt also stated about 2 weeks ago she had some diarrhea.     Discussed the use of AI scribe software for clinical note transcription with the patient, who gave verbal consent to proceed.  History of Present Illness Kaylee Carpenter is a 68 year old female who presents with persistent respiratory symptoms.  Respiratory symptoms - Persistent symptoms for approximately one month - Initial episode improved with course of doxycycline  and then symptoms returned again last week - Symptoms improved, then recurred last week with hoarse voice and feeling very unwell - No chest pain - Ongoing sensation of chest heaviness with mucus that is difficult to expectorate  Gastrointestinal symptoms - Acute diarrhea lasting about one week, now resolved  Medication use and symptom management - Uses Nyquil for symptomatic relief - Avoids nasal sprays due to concern about side effects - Prolonged use of Mucinex did not provide benefit      Review of Systems  HENT:  Positive for congestion.   Respiratory:  Positive for cough.   Cardiovascular:  Negative for chest pain.      Objective:     BP 120/72   Pulse 70   Temp 97.7 F (36.5 C) (Temporal)   Ht 5' 4 (1.626 m)   Wt 285 lb 8 oz (129.5 kg)   SpO2 97%   BMI 49.01 kg/m    Physical Exam Vitals reviewed.  Constitutional:      General: She is not in acute distress.    Appearance: Normal appearance.  HENT:     Head: Normocephalic and atraumatic.  Cardiovascular:     Rate and Rhythm: Normal rate and regular rhythm.     Pulses: Normal pulses.     Heart sounds: Normal heart sounds.  Pulmonary:     Effort: Pulmonary effort is normal.     Breath sounds: Normal breath  sounds.  Skin:    General: Skin is warm and dry.  Neurological:     General: No focal deficit present.     Mental Status: She is alert and oriented to person, place, and time.  Psychiatric:        Mood and Affect: Mood normal.        Behavior: Behavior normal.        Judgment: Judgment normal.      No results found for any visits on 01/14/24.    The 10-year ASCVD risk score (Arnett DK, et al., 2019) is: 24%    Assessment & Plan:   Problem List Items Addressed This Visit       Respiratory   Rhinitis   Relevant Medications   fluticasone  (FLONASE ) 50 MCG/ACT nasal spray     Other   Cough - Primary   Relevant Medications   amoxicillin -clavulanate (AUGMENTIN ) 875-125 MG tablet   Other Relevant Orders   DG Chest 2 View   Assessment and Plan Assessment & Plan Acute upper respiratory infection Persistent symptoms despite doxycycline . Possible new bacterial or viral infection. Clear lungs, but chest x-ray needed to exclude pneumonia. - Ordered chest x-ray to rule out pneumonia. - Prescribed Augmentin  1 tablet twice a day for 10 days. - Prescribed Flonase  nasal spray, 1-2 sprays once a day  until symptoms improve. - Advised to use Flonase  at bedtime for nighttime symptoms. - Instructed to report if diarrhea recurs or symptoms do not improve.    Return if symptoms worsen or fail to improve.    Lauraine FORBES Pereyra, NP  "

## 2024-01-17 ENCOUNTER — Ambulatory Visit: Payer: Self-pay | Admitting: Nurse Practitioner

## 2024-02-12 ENCOUNTER — Other Ambulatory Visit

## 2024-02-12 DIAGNOSIS — E1169 Type 2 diabetes mellitus with other specified complication: Secondary | ICD-10-CM | POA: Diagnosis not present

## 2024-02-12 DIAGNOSIS — E785 Hyperlipidemia, unspecified: Secondary | ICD-10-CM | POA: Diagnosis not present

## 2024-02-12 LAB — LIPID PANEL
Cholesterol: 190 mg/dL (ref 28–200)
HDL: 51.9 mg/dL
LDL Cholesterol: 117 mg/dL — ABNORMAL HIGH (ref 10–99)
NonHDL: 138.28
Total CHOL/HDL Ratio: 4
Triglycerides: 107 mg/dL (ref 10.0–149.0)
VLDL: 21.4 mg/dL (ref 0.0–40.0)

## 2024-02-12 LAB — COMPREHENSIVE METABOLIC PANEL WITH GFR
ALT: 14 U/L (ref 3–35)
AST: 20 U/L (ref 5–37)
Albumin: 4 g/dL (ref 3.5–5.2)
Alkaline Phosphatase: 76 U/L (ref 39–117)
BUN: 23 mg/dL (ref 6–23)
CO2: 29 meq/L (ref 19–32)
Calcium: 10.8 mg/dL — ABNORMAL HIGH (ref 8.4–10.5)
Chloride: 102 meq/L (ref 96–112)
Creatinine, Ser: 1.13 mg/dL (ref 0.40–1.20)
GFR: 49.32 mL/min — ABNORMAL LOW
Glucose, Bld: 94 mg/dL (ref 70–99)
Potassium: 3.9 meq/L (ref 3.5–5.1)
Sodium: 139 meq/L (ref 135–145)
Total Bilirubin: 0.3 mg/dL (ref 0.2–1.2)
Total Protein: 7.7 g/dL (ref 6.0–8.3)

## 2024-02-12 LAB — HEMOGLOBIN A1C: Hgb A1c MFr Bld: 6 % (ref 4.6–6.5)

## 2024-02-13 ENCOUNTER — Ambulatory Visit: Admitting: Nurse Practitioner

## 2024-02-13 VITALS — BP 110/72 | HR 94 | Temp 98.6°F | Ht 64.0 in | Wt 284.0 lb

## 2024-02-13 DIAGNOSIS — E785 Hyperlipidemia, unspecified: Secondary | ICD-10-CM | POA: Diagnosis not present

## 2024-02-13 DIAGNOSIS — E1169 Type 2 diabetes mellitus with other specified complication: Secondary | ICD-10-CM

## 2024-02-13 DIAGNOSIS — G720 Drug-induced myopathy: Secondary | ICD-10-CM

## 2024-02-13 DIAGNOSIS — Z7984 Long term (current) use of oral hypoglycemic drugs: Secondary | ICD-10-CM | POA: Diagnosis not present

## 2024-02-13 NOTE — Assessment & Plan Note (Signed)
 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 Statin-induced myopathy with rosuvastatin  and atorvastatin . Discussed ezetimibe and Repatha as alternatives. - Referred to pharmacist for Repatha prior authorization. - Documented statin myopathy  - Continue Rybelsus  7mg /day

## 2024-02-13 NOTE — Progress Notes (Signed)
 "  Established Patient Office Visit  Subjective   Patient ID: Kaylee Carpenter, female    DOB: 1955-06-21  Age: 69 y.o. MRN: [id number]  Chief Complaint  Patient presents with   Medical Management of Chronic Issues    Follow-up on cholesterol: Patient is not taking her rosuvastatin  due to leg pain.    Discussed the use of AI scribe software for clinical note transcription with the patient, who gave verbal consent to proceed.  History of Present Illness Kaylee Carpenter is a 69 year old female who presents for management of elevated LDL cholesterol.  T2DM with associated Hyperlipidemia - LDL cholesterol 117 mg/dL, above target goal (goal <55) - Muscle aches with both rosuvastatin  and atorvastatin , leading to discontinuation of these medications - Last A1C 6.0, indicating good control , on rybelsus  7mg /day  Hypercalcemia and endocrine evaluation - Elevated calcium  on recent laboratory testing - Scheduled endocrinology evaluation for calcium  and thyroid /parathyroid disorders       Review of Systems  Cardiovascular:  Negative for chest pain and palpitations.      Objective:     BP 110/72   Pulse 94   Temp 98.6 F (37 C) (Temporal)   Ht 5' 4 (1.626 m)   Wt 284 lb (128.8 kg)   SpO2 95%   BMI 48.75 kg/m  BP Readings from Last 3 Encounters:  02/13/24 110/72  01/14/24 120/72  01/08/24 124/76   Wt Readings from Last 3 Encounters:  02/13/24 284 lb (128.8 kg)  01/14/24 285 lb 8 oz (129.5 kg)  01/08/24 282 lb (127.9 kg)      Physical Exam Vitals reviewed.  Constitutional:      General: She is not in acute distress.    Appearance: Normal appearance.  HENT:     Head: Normocephalic and atraumatic.  Cardiovascular:     Rate and Rhythm: Normal rate and regular rhythm.     Pulses: Normal pulses.     Heart sounds: Normal heart sounds.  Pulmonary:     Effort: Pulmonary effort is normal.     Breath sounds: Normal breath sounds.  Skin:    General: Skin is  warm and dry.  Neurological:     General: No focal deficit present.     Mental Status: She is alert and oriented to person, place, and time.  Psychiatric:        Mood and Affect: Mood normal.        Behavior: Behavior normal.        Judgment: Judgment normal.      No results found for any visits on 02/13/24.    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 Statin-induced myopathy with rosuvastatin  and atorvastatin . Discussed ezetimibe and Repatha as alternatives. - Referred to pharmacist for Repatha prior authorization. 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 Statin-induced myopathy with rosuvastatin  and atorvastatin . Discussed ezetimibe and Repatha as alternatives. - Referred  to pharmacist for Repatha prior authorization. 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 Statin-induced myopathy with rosuvastatin  and atorvastatin . Discussed ezetimibe and Repatha as alternatives. - Referred to pharmacist for Repatha prior authorization. - Documented statin myopathy  - Continue Rybelsus  7mg /day    Return in about 5 months (around 07/13/2024) for F/U with Nicoya Friel.    Lauraine FORBES Pereyra, NP  "

## 2024-02-14 ENCOUNTER — Telehealth: Payer: Self-pay | Admitting: Nurse Practitioner

## 2024-02-14 NOTE — Progress Notes (Signed)
 Care Guide Pharmacy Note  02/14/2024 Name: Kaylee Carpenter MRN: [id number] DOB: April 05, 1955  Referred By: Elnor Lauraine BRAVO, NP Reason for referral: Call Attempt #1 and Complex Care Management (Outreach to sch ref w/ pharm)   Kaylee Carpenter is a 69 y.o. year old female who is a primary care patient of Elnor Lauraine BRAVO, NP.  Kaylee Carpenter was referred to the pharmacist for assistance related to: HLD  Successful contact was made with the patient to discuss pharmacy services including being ready for the pharmacist to call at least 5 minutes before the scheduled appointment time and to have medication bottles and any blood pressure readings ready for review. The patient agreed to meet with the pharmacist via telephone visit on 03/03/2024.  [facility], [facility] Guide Direct Dial: [phone]  Fax: [phone]

## 2024-02-24 ENCOUNTER — Ambulatory Visit: Admitting: Family Medicine

## 2024-02-25 ENCOUNTER — Ambulatory Visit: Admitting: Family Medicine

## 2024-02-26 ENCOUNTER — Ambulatory Visit: Admitting: "Endocrinology

## 2024-02-26 ENCOUNTER — Encounter: Payer: Self-pay | Admitting: "Endocrinology

## 2024-02-26 ENCOUNTER — Other Ambulatory Visit

## 2024-02-26 VITALS — BP 106/80 | HR 75 | Ht 64.0 in | Wt 280.0 lb

## 2024-02-26 DIAGNOSIS — E213 Hyperparathyroidism, unspecified: Secondary | ICD-10-CM

## 2024-02-26 NOTE — Patient Instructions (Addendum)
" °  VISIT SUMMARY: Today, we discussed your elevated calcium  levels and the possible causes. We reviewed your symptoms and medical history, and performed some tests to better understand your condition.  YOUR PLAN: -PRIMARY HYPERPARATHYROIDISM: Primary hyperparathyroidism is a condition where one or more of your parathyroid glands are overactive, causing high levels of calcium  in your blood. We have ordered a 24-hour urine calcium  test and performed blood work today to help determine the cause. We will monitor your condition with lab tests every six months and urine tests annually. Additionally, you will need a bone density test every two years to check for any bone loss.  INSTRUCTIONS: Please complete the 24-hour urine calcium  test as instructed and attend your follow-up appointment in six weeks to review the results. Continue with your current medications and report any new symptoms.    Contains text generated by [employer].    You should collect every drop of urine during each 24 hour period. It does not matter how much or little urine is passed each time, as long as every drop is collected.   Begin the urine collection in the morning after you wake up.  The first time your empty your bladder, flush it down the toilet because that was yesterday's urine. Write down the exact time (eg, 6:15 AM). You will begin the urine collection at this time.  Every other time you urinate for the rest of the day and through the night, the urine goes into the jug.  Also the first time you empty your bladder the next morning, that goes into the jug.  This should be within 10 minutes before or after the time of the first morning void on the first day (which was flushed).  Bring the jug back to the lab when you have completed this.    If you need to urinate one hour before the final collection time, drink a full glass of water so that you can void again at the appropriate time. If you have to urinate 20 minutes before, try  to hold the urine until the proper time.  Please note the exact time of the final collection, even if it is not the same time as when collection began on day one.    Store the jug in the refrigerator.  If you need to have a bowel movement, any urine passed with the bowel movement should be collected. Try not to include feces with the urine collection. If feces does get mixed in, do not try to remove the feces from the urine collection bottle.    "

## 2024-02-26 NOTE — Progress Notes (Signed)
 "   Outpatient Endocrinology Note [name], MD    Kaylee Carpenter Dec 04, 1955 [id number]  Referring Provider: Elnor Lauraine BRAVO, NP Primary Care Provider: Elnor Lauraine BRAVO, NP Reason for consultation: Subjective   Assessment & Plan  Kaylee Carpenter was seen today for hyperparathyroidism.  Diagnoses and all orders for this visit:  Hyperparathyroidism -     Calcium , 24-Hour Urine with Creatinine -     PTH, intact and calcium  -     Renal function panel -     Vitamin D  1,25 dihydroxy; Future -     Magnesium; Future  Hypercalcemia -     Calcium , 24-Hour Urine with Creatinine -     PTH, intact and calcium  -     Renal function panel -     Vitamin D  1,25 dihydroxy; Future -     Magnesium; Future    Assessment and Plan Assessment & Plan Hyperparathyroidism Elevated calcium  levels with low kidney function. No significant hypercalcemia symptoms.  - Ordered 24-hour urine calcium  test. - Performed blood work today. - Scheduled follow-up in six weeks to review results. - Monitor with labs every six months and urine testing annually. - Bone density test every two years, scheduled in 03/2024.[name]   Return in about 2 months (around 04/27/2024) for visit, labs today.   I have reviewed current medications, nurse's notes, allergies, vital signs, past medical and surgical history, family medical history, and social history for this encounter. Counseled patient on symptoms, examination findings, lab findings, imaging results, treatment decisions and monitoring and prognosis. The patient understood the recommendations and agrees with the treatment plan. All questions regarding treatment plan were fully answered.  [name], MD  02/26/24   History of Present Illness HPI  Discussed the use of AI scribe software for clinical note transcription with the patient, who gave verbal consent to proceed.  History of Present Illness Kaylee Carpenter is a 69 year old female who presents with  elevated calcium  levels. She was referred by Dr. Lauraine for evaluation of elevated calcium  levels noted in recent lab results.  She first became aware of elevated calcium  in October 2024 on routine blood work and has had no prior known parathyroid disease.  She has increased nighttime urination and thirst while taking spironolactone  and torsemide  for hypertension. She has no history of kidney stones, hematuria, or excessive daytime urination.  She has no abdominal pain, heartburn, nausea, vomiting, ulcers, or use of calcium  supplements. She takes vitamin D  without calcium  and does not use lithium or hydrochlorothiazide.  She has no personal or family history of kidney stones, high calcium  levels, MEN syndrome, medullary thyroid  cancer, or pheochromocytoma. She denies thyroid  cancer.  She denies depression, confusion, fatigue, fractures, osteoporosis, headaches, or tingling. She reports occasional numbness in her left hand.   Patient denies a history of kidney stones. She  current hematuria No polyuria No nocturia Yes, on spironolactone  and torsemide  thirst Yes renal dysfunction Yes anorexia No abdominal pain No heartburn No constipation Yes nausea or vomiting No history of peptic ulcer disease No depression No confusion No excessive fatigue No fracture No osteoporosis No headaches No numbness Yes, occasional numbness of L hand>R hand tingling No  She takes Calcium  No She takes Vitamin D  supplements Yes  She a history of taking chronic lithium No She a recent history of thiazide diuretic intake No  She family history of renal stones/hypercalcemia No a personal history of MEN syndromes/medullary thyroid  cancer/ pheochromocytoma No  Physical Exam  BP 106/80  Pulse 75   Ht 5' 4 (1.626 m)   Wt 280 lb (127 kg)   SpO2 96%   BMI 48.06 kg/m    Constitutional: well developed, well nourished Head: normocephalic, atraumatic Eyes: sclera anicteric, no redness Neck:  supple Lungs: normal respiratory effort Neurology: alert and oriented Skin: dry, no appreciable rashes Musculoskeletal: no appreciable defects Psychiatric: normal mood and affect   Current Medications Patient's Medications  New Prescriptions   No medications on file  Previous Medications   ACETAMINOPHEN  (TYLENOL ) 500 MG TABLET    Take 500-1,000 mg by mouth every 8 (eight) hours as needed.   ASPIRIN 81 MG TABLET    Take 81 mg by mouth daily.   BLOOD GLUCOSE MONITORING SUPPL DEVI    1 each by Does not apply route as directed. Check blood sugar daily   CARVEDILOL  (COREG ) 6.25 MG TABLET    Take 1 tablet (6.25 mg total) by mouth 2 (two) times daily with a meal.   CETIRIZINE  (ZYRTEC ) 10 MG TABLET    Take 1 tablet (10 mg total) by mouth daily.   CHOLECALCIFEROL (VITAMIN D ) 1000 UNITS TABLET    Take 10,000 Units by mouth daily.   DIAZEPAM (VALIUM) 10 MG TABLET    Take by mouth.   DICLOFENAC  SODIUM (VOLTAREN  ARTHRITIS PAIN) 1 % GEL    Apply 4 g topically 4 (four) times daily.   GLUCOSE BLOOD (BLOOD GLUCOSE TEST STRIPS) STRP    1 each by Does not apply route as directed. Check blood sugar daily   IBUPROFEN  (ADVIL ) 400 MG TABLET    Take 400 mg by mouth as needed.   LANCET DEVICE MISC    1 each by Does not apply route as directed. Check blood sugar daily   LANCETS MISC    1 each by Does not apply route as directed. Check blood sugar daily   OMEGA-3 FATTY ACIDS (FISH OIL) 1000 MG CAPS    Take by mouth.   SEMAGLUTIDE  (RYBELSUS ) 7 MG TABS    Take 1 tablet (7 mg total) by mouth daily.   SPIRONOLACTONE  (ALDACTONE ) 25 MG TABLET    TAKE 1 TABLET(25 MG) BY MOUTH DAILY   TORSEMIDE  (DEMADEX ) 20 MG TABLET    TAKE 1 TABLET(20 MG) BY MOUTH DAILY  Modified Medications   No medications on file  Discontinued Medications   No medications on file    Allergies Allergies[1]  Past Medical History Past Medical History:  Diagnosis Date   Arthritis    in her back had a shot in march 2024. 08/28/2022   Back  pain    Edema    High cholesterol    Hypertension    Hyperthyroidism 2007   Graves Disease   Joint pain    OSA on CPAP    doesn't use CPAP anymore and doesn't have it . 08/28/2022   PVD (peripheral vascular disease)    see Dr. KYM Blade   Shortness of breath    due to edema and weight   Sleep apnea    uses a CPAP   Wears dentures    upper 08/28/2022   Wears partial dentures    lower . 08/28/2022    Past Surgical History Past Surgical History:  Procedure Laterality Date   CHOLECYSTECTOMY  1985   COLONOSCOPY WITH PROPOFOL  N/A 02/24/2016   Procedure: COLONOSCOPY WITH PROPOFOL ;  Surgeon: Belvie Just, MD;  Location: [facility];  Service: Endoscopy;  Laterality: N/A;   DILATATION & CURETTAGE/HYSTEROSCOPY WITH MYOSURE N/A 03/22/2016  Procedure: DILATATION & CURETTAGE/HYSTEROSCOPY WITH MYOSURE;  Surgeon: Dickie Carder, MD;  Location: [facility];  Service: Gynecology;  Laterality: N/A;   DILATATION & CURETTAGE/HYSTEROSCOPY WITH MYOSURE N/A 09/06/2022   Procedure: DILATATION & CURETTAGE/HYSTEROSCOPY;  Surgeon: Laurence Slater PARAS, MD;  Location: [facility];  Service: Gynecology;  Laterality: N/A;   DILATATION & CURRETTAGE/HYSTEROSCOPY WITH RESECTOCOPE N/A 09/17/2013   Procedure: DILATATION & CURETTAGE, HYSTEROSCOPY, CYSTOSCOPY;  Surgeon: Jon CINDERELLA Rummer, MD;  Location: [facility];  Service: Gynecology;  Laterality: N/A;   DILATION AND CURETTAGE OF UTERUS     INTRAUTERINE DEVICE (IUD) INSERTION N/A 09/06/2022   Procedure: INTRAUTERINE DEVICE (IUD) INSERTION;  Surgeon: Laurence Slater PARAS, MD;  Location: [facility];  Service: Gynecology;  Laterality: N/A;    Family History family history includes Cancer in her father and mother; Colon cancer in her father; Diabetes in her mother; Hypertension in her father and mother; Obesity in her mother; Thyroid  disease in her mother.  Social History Social History   Socioeconomic History   Marital status: Divorced    Spouse name: Not  on file   Number of children: 2   Years of education: BS   Highest education level: Not on file  Occupational History   Not on file  Tobacco Use   Smoking status: Never   Smokeless tobacco: Never  Vaping Use   Vaping status: Never Used  Substance and Sexual Activity   Alcohol use: No   Drug use: No   Sexual activity: Not Currently    Birth control/protection: Post-menopausal  Other Topics Concern   Not on file  Social History Narrative   Drinks about 1-2 sodas a week.    Social Drivers of Health   Tobacco Use: Low Risk (02/26/2024)   Patient History    Smoking Tobacco Use: Never    Smokeless Tobacco Use: Never    Passive Exposure: Not on file  Financial Resource Strain: Low Risk (09/18/2023)   Overall Financial Resource Strain (CARDIA)    Difficulty of Paying Living Expenses: Not hard at all  Food Insecurity: No Food Insecurity (09/18/2023)   Epic    Worried About Radiation Protection Practitioner of Food in the Last Year: Never true    Ran Out of Food in the Last Year: Never true  Transportation Needs: No Transportation Needs (09/18/2023)   Epic    Lack of Transportation (Medical): No    Lack of Transportation (Non-Medical): No  Physical Activity: Inactive (09/18/2023)   Exercise Vital Sign    Days of Exercise per Week: 0 days    Minutes of Exercise per Session: 0 min  Stress: Stress Concern Present (09/18/2023)   [employer] of Occupational Health - Occupational Stress Questionnaire    Feeling of Stress: To some extent  Social Connections: Moderately Integrated (09/18/2023)   Social Connection and Isolation Panel    Frequency of Communication with Friends and Family: Never    Frequency of Social Gatherings with Friends and Family: More than three times a week    Attends Religious Services: More than 4 times per year    Active Member of [employer] or Organizations: Yes    Attends Banker Meetings: More than 4 times per year    Marital Status: Divorced  Intimate Partner  Violence: Not At Risk (09/18/2023)   Epic    Fear of Current or Ex-Partner: No    Emotionally Abused: No    Physically Abused: No    Sexually Abused: No  Depression (PHQ2-9): Low  Risk (11/07/2023)   Depression (PHQ2-9)    PHQ-2 Score: 0  Alcohol Screen: Low Risk (09/18/2023)   Alcohol Screen    Last Alcohol Screening Score (AUDIT): 0  Housing: Unknown (09/18/2023)   Epic    Unable to Pay for Housing in the Last Year: No    Number of Times Moved in the Last Year: Not on file    Homeless in the Last Year: No  Utilities: Not At Risk (09/18/2023)   Epic    Threatened with loss of utilities: No  Health Literacy: Adequate Health Literacy (09/18/2023)   B1300 Health Literacy    Frequency of need for help with medical instructions: Never    Lab Results  Component Value Date   CHOL 190 02/12/2024   Lab Results  Component Value Date   HDL 51.90 02/12/2024   Lab Results  Component Value Date   LDLCALC 117 (H) 02/12/2024   Lab Results  Component Value Date   TRIG 107.0 02/12/2024   Lab Results  Component Value Date   CHOLHDL 4 02/12/2024   Lab Results  Component Value Date   CREATININE 1.13 02/12/2024   Lab Results  Component Value Date   GFR 49.32 (L) 02/12/2024      Component Value Date/Time   NA 139 02/12/2024 0754   K 3.9 02/12/2024 0754   CL 102 02/12/2024 0754   CO2 29 02/12/2024 0754   GLUCOSE 94 02/12/2024 0754   BUN 23 02/12/2024 0754   CREATININE 1.13 02/12/2024 0754   CALCIUM  10.8 (H) 02/12/2024 0754   PROT 7.7 02/12/2024 0754   ALBUMIN 4.0 02/12/2024 0754   AST 20 02/12/2024 0754   ALT 14 02/12/2024 0754   ALKPHOS 76 02/12/2024 0754   BILITOT 0.3 02/12/2024 0754   GFRNONAA >60 08/04/2021 1948   GFRAA >60 03/21/2016 1030      Latest Ref Rng & Units 02/12/2024    7:54 AM 11/07/2023    1:57 PM 04/04/2023    4:23 PM  BMP  Glucose 70 - 99 mg/dL 94  87  87   BUN 6 - 23 mg/dL 23  25  13    Creatinine 0.40 - 1.20 mg/dL 8.86  8.93  8.97   Sodium 135 -  145 mEq/L 139  136  137   Potassium 3.5 - 5.1 mEq/L 3.9  4.1  4.3   Chloride 96 - 112 mEq/L 102  99  102   CO2 19 - 32 mEq/L 29  31  28    Calcium  8.4 - 10.5 mg/dL 89.1  89.0    89.1  89.6    10.2        Component Value Date/Time   WBC 5.9 04/25/2023 0921   WBC 5.1 10/25/2022 0854   RBC 4.79 04/25/2023 0921   RBC 4.63 10/25/2022 0854   HGB 13.3 04/25/2023 0921   HCT 40.5 04/25/2023 0921   PLT 321 04/25/2023 0921   MCV 85 04/25/2023 0921   MCH 27.8 04/25/2023 0921   MCH 27.7 08/31/2022 0825   MCHC 32.8 04/25/2023 0921   MCHC 32.0 10/25/2022 0854   RDW 12.5 04/25/2023 0921   LYMPHSABS 2.3 08/04/2021 1948   MONOABS 0.5 08/04/2021 1948   EOSABS 0.1 08/04/2021 1948   BASOSABS 0.0 08/04/2021 1948   Lab Results  Component Value Date   TSH 1.550 04/25/2023   TSH 1.28 10/25/2022   TSH 1.54 07/16/2022   FREET4 0.84 03/20/2021   FREET4 1.11 08/22/2009  Parts of this note may have been dictated using voice recognition software. There may be variances in spelling and vocabulary which are unintentional. Not all errors are proofread. Please notify the dino if any discrepancies are noted or if the meaning of any statement is not clear.      [1]  Allergies Allergen Reactions   Atorvastatin      Myopathy    Latex Other (See Comments)    Top layer of skin came off   "

## 2024-02-27 LAB — RENAL FUNCTION PANEL
Albumin: 4.2 g/dL (ref 3.6–5.1)
BUN/Creatinine Ratio: 19 (calc) (ref 6–22)
BUN: 20 mg/dL (ref 7–25)
CO2: 28 mmol/L (ref 20–32)
Calcium: 11.1 mg/dL — ABNORMAL HIGH (ref 8.6–10.4)
Chloride: 101 mmol/L (ref 98–110)
Creat: 1.05 mg/dL — ABNORMAL HIGH (ref 0.60–1.00)
Glucose, Bld: 91 mg/dL (ref 65–99)
Phosphorus: 3.5 mg/dL (ref 2.1–4.3)
Potassium: 4.5 mmol/L (ref 3.5–5.3)
Sodium: 137 mmol/L (ref 135–146)

## 2024-02-27 LAB — PTH, INTACT AND CALCIUM
Calcium: 11.1 mg/dL — ABNORMAL HIGH (ref 8.6–10.4)
PTH: 170 pg/mL — ABNORMAL HIGH (ref 16–77)

## 2024-03-03 ENCOUNTER — Other Ambulatory Visit

## 2024-03-04 ENCOUNTER — Ambulatory Visit: Admitting: Family Medicine

## 2024-04-27 ENCOUNTER — Ambulatory Visit: Admitting: "Endocrinology

## 2024-07-23 ENCOUNTER — Ambulatory Visit: Admitting: Nurse Practitioner

## 2024-09-18 ENCOUNTER — Ambulatory Visit
